# Patient Record
Sex: Male | Born: 1935 | Race: White | Hispanic: No
Health system: Southern US, Community
[De-identification: ages and names within clinical notes are randomized; demographics above are authoritative.]

---

## 2003-08-31 ENCOUNTER — Other Ambulatory Visit: Payer: Self-pay

## 2004-11-29 ENCOUNTER — Ambulatory Visit: Payer: Self-pay | Admitting: Unknown Physician Specialty

## 2006-08-08 ENCOUNTER — Ambulatory Visit: Payer: Self-pay | Admitting: General Practice

## 2006-10-09 ENCOUNTER — Ambulatory Visit: Payer: Self-pay | Admitting: General Practice

## 2006-10-24 ENCOUNTER — Inpatient Hospital Stay: Payer: Self-pay | Admitting: General Practice

## 2008-05-25 ENCOUNTER — Ambulatory Visit: Payer: Self-pay | Admitting: Unknown Physician Specialty

## 2008-06-21 ENCOUNTER — Ambulatory Visit: Payer: Self-pay | Admitting: Unknown Physician Specialty

## 2008-07-19 ENCOUNTER — Ambulatory Visit: Payer: Self-pay | Admitting: Surgery

## 2008-08-23 ENCOUNTER — Ambulatory Visit: Payer: Self-pay | Admitting: Surgery

## 2009-05-23 ENCOUNTER — Ambulatory Visit: Payer: Self-pay | Admitting: Unknown Physician Specialty

## 2009-06-24 ENCOUNTER — Ambulatory Visit: Payer: Self-pay | Admitting: Surgery

## 2009-07-04 ENCOUNTER — Inpatient Hospital Stay: Payer: Self-pay | Admitting: Surgery

## 2010-06-06 ENCOUNTER — Ambulatory Visit: Payer: Self-pay | Admitting: Unknown Physician Specialty

## 2011-06-25 ENCOUNTER — Ambulatory Visit: Payer: Self-pay | Admitting: Unknown Physician Specialty

## 2011-08-30 ENCOUNTER — Ambulatory Visit: Payer: Self-pay | Admitting: Internal Medicine

## 2011-09-27 ENCOUNTER — Inpatient Hospital Stay: Payer: Self-pay | Admitting: Family Medicine

## 2011-09-27 LAB — CBC WITH DIFFERENTIAL/PLATELET
Basophil #: 0 10*3/uL (ref 0.0–0.1)
Basophil %: 0.3 %
Eosinophil %: 0.5 %
HCT: 28.5 % — ABNORMAL LOW (ref 40.0–52.0)
Lymphocyte #: 0.8 10*3/uL — ABNORMAL LOW (ref 1.0–3.6)
MCHC: 30.9 g/dL — ABNORMAL LOW (ref 32.0–36.0)
Monocyte #: 0.5 x10 3/mm (ref 0.2–1.0)
Monocyte %: 3.7 %
Platelet: 764 10*3/uL — ABNORMAL HIGH (ref 150–440)
RDW: 18.2 % — ABNORMAL HIGH (ref 11.5–14.5)
WBC: 14.3 10*3/uL — ABNORMAL HIGH (ref 3.8–10.6)

## 2011-09-27 LAB — RENAL FUNCTION PANEL
Albumin: 2.2 g/dL — ABNORMAL LOW (ref 3.4–5.0)
Anion Gap: 12 (ref 7–16)
BUN: 60 mg/dL — ABNORMAL HIGH (ref 7–18)
Calcium, Total: 9.1 mg/dL (ref 8.5–10.1)
Chloride: 94 mmol/L — ABNORMAL LOW (ref 98–107)
Creatinine: 2.93 mg/dL — ABNORMAL HIGH (ref 0.60–1.30)
EGFR (African American): 23 — ABNORMAL LOW
EGFR (Non-African Amer.): 20 — ABNORMAL LOW
Glucose: 103 mg/dL — ABNORMAL HIGH (ref 65–99)
Osmolality: 282 (ref 275–301)
Phosphorus: 4.7 mg/dL (ref 2.5–4.9)

## 2011-09-27 LAB — URINALYSIS, COMPLETE
Blood: NEGATIVE
Glucose,UR: NEGATIVE mg/dL (ref 0–75)
Ketone: NEGATIVE
Nitrite: NEGATIVE
Ph: 5 (ref 4.5–8.0)
WBC UR: 13 /HPF (ref 0–5)

## 2011-09-27 LAB — TROPONIN I
Troponin-I: <0.02 ng/mL
Troponin-I: <0.02 ng/mL

## 2011-09-27 LAB — CK TOTAL AND CKMB (NOT AT ARMC)
CK, Total: 101 U/L (ref 35–232)
CK-MB: 2.5 ng/mL (ref 0.5–3.6)

## 2011-09-28 LAB — UR PROT ELECTROPHORESIS, URINE RANDOM

## 2011-09-28 LAB — COMPREHENSIVE METABOLIC PANEL
Alkaline Phosphatase: 122 U/L (ref 50–136)
Anion Gap: 7 (ref 7–16)
Calcium, Total: 8.4 mg/dL — ABNORMAL LOW (ref 8.5–10.1)
Chloride: 100 mmol/L (ref 98–107)
Co2: 27 mmol/L (ref 21–32)
EGFR (African American): 39 — ABNORMAL LOW
Osmolality: 280 (ref 275–301)
Potassium: 5.6 mmol/L — ABNORMAL HIGH (ref 3.5–5.1)
SGOT(AST): 29 U/L (ref 15–37)
SGPT (ALT): 27 U/L
Sodium: 134 mmol/L — ABNORMAL LOW (ref 136–145)

## 2011-09-28 LAB — CBC WITH DIFFERENTIAL/PLATELET
Basophil #: 0 10*3/uL (ref 0.0–0.1)
Basophil %: 0.5 %
Eosinophil %: 0.9 %
HCT: 26.4 % — ABNORMAL LOW (ref 40.0–52.0)
Lymphocyte #: 0.8 10*3/uL — ABNORMAL LOW (ref 1.0–3.6)
Lymphocyte %: 7.2 %
MCH: 22.8 pg — ABNORMAL LOW (ref 26.0–34.0)
MCV: 76 fL — ABNORMAL LOW (ref 80–100)
Monocyte #: 0.6 x10 3/mm (ref 0.2–1.0)
Monocyte %: 5.2 %
Neutrophil #: 9.3 10*3/uL — ABNORMAL HIGH (ref 1.4–6.5)
Neutrophil %: 86.2 %
Platelet: 627 10*3/uL — ABNORMAL HIGH (ref 150–440)
RBC: 3.48 10*6/uL — ABNORMAL LOW (ref 4.40–5.90)
RDW: 18.5 % — ABNORMAL HIGH (ref 11.5–14.5)

## 2011-09-28 LAB — CK TOTAL AND CKMB (NOT AT ARMC)
CK, Total: 59 U/L (ref 35–232)
CK-MB: 1.9 ng/mL (ref 0.5–3.6)

## 2011-09-28 LAB — TROPONIN I: Troponin-I: <0.02 ng/mL

## 2011-09-29 LAB — CBC WITH DIFFERENTIAL/PLATELET
Basophil #: 0.1 10*3/uL (ref 0.0–0.1)
Basophil %: 1.2 %
Eosinophil %: 0.7 %
HCT: 26.8 % — ABNORMAL LOW (ref 40.0–52.0)
HGB: 8.3 g/dL — ABNORMAL LOW (ref 13.0–18.0)
Lymphocyte %: 7.6 %
MCH: 23.7 pg — ABNORMAL LOW (ref 26.0–34.0)
MCHC: 30.8 g/dL — ABNORMAL LOW (ref 32.0–36.0)
MCV: 77 fL — ABNORMAL LOW (ref 80–100)
Monocyte #: 0.6 x10 3/mm (ref 0.2–1.0)
Neutrophil #: 10.1 10*3/uL — ABNORMAL HIGH (ref 1.4–6.5)
RBC: 3.49 10*6/uL — ABNORMAL LOW (ref 4.40–5.90)

## 2011-09-29 LAB — RENAL FUNCTION PANEL
Anion Gap: 10 (ref 7–16)
Calcium, Total: 8.4 mg/dL — ABNORMAL LOW (ref 8.5–10.1)
Chloride: 100 mmol/L (ref 98–107)
Glucose: 91 mg/dL (ref 65–99)
Osmolality: 275 (ref 275–301)
Phosphorus: 3.7 mg/dL (ref 2.5–4.9)

## 2011-09-29 LAB — IRON AND TIBC
Iron Saturation: 7 %
Iron: 11 ug/dL — ABNORMAL LOW (ref 65–175)
Unbound Iron-Bind.Cap.: 146 ug/dL

## 2011-09-29 LAB — URINE CULTURE

## 2011-09-30 LAB — CBC WITH DIFFERENTIAL/PLATELET
Basophil #: 0.1 10*3/uL (ref 0.0–0.1)
Basophil %: 0.7 %
Eosinophil #: 0.1 10*3/uL (ref 0.0–0.7)
HCT: 28.9 % — ABNORMAL LOW (ref 40.0–52.0)
MCH: 23.7 pg — ABNORMAL LOW (ref 26.0–34.0)
MCHC: 30.5 g/dL — ABNORMAL LOW (ref 32.0–36.0)
MCV: 78 fL — ABNORMAL LOW (ref 80–100)
Monocyte #: 0.4 x10 3/mm (ref 0.2–1.0)
Monocyte %: 4 %
Neutrophil #: 8.6 10*3/uL — ABNORMAL HIGH (ref 1.4–6.5)
Neutrophil %: 88 %
Platelet: 595 10*3/uL — ABNORMAL HIGH (ref 150–440)

## 2011-09-30 LAB — BASIC METABOLIC PANEL
BUN: 16 mg/dL (ref 7–18)
Calcium, Total: 8.6 mg/dL (ref 8.5–10.1)
Chloride: 99 mmol/L (ref 98–107)
EGFR (African American): >60
EGFR (Non-African Amer.): >60
Glucose: 106 mg/dL — ABNORMAL HIGH (ref 65–99)
Sodium: 134 mmol/L — ABNORMAL LOW (ref 136–145)

## 2011-10-01 LAB — BASIC METABOLIC PANEL
BUN: 14 mg/dL (ref 7–18)
Calcium, Total: 8.3 mg/dL — ABNORMAL LOW (ref 8.5–10.1)
Creatinine: 1.08 mg/dL (ref 0.60–1.30)
EGFR (African American): >60
EGFR (Non-African Amer.): >60
Glucose: 109 mg/dL — ABNORMAL HIGH (ref 65–99)
Potassium: 4.2 mmol/L (ref 3.5–5.1)

## 2011-10-03 LAB — CBC WITH DIFFERENTIAL/PLATELET
Basophil #: 0 10*3/uL (ref 0.0–0.1)
Eosinophil #: 0.1 10*3/uL (ref 0.0–0.7)
Lymphocyte #: 1 10*3/uL (ref 1.0–3.6)
Lymphocyte %: 9.8 %
MCH: 23.5 pg — ABNORMAL LOW (ref 26.0–34.0)
MCV: 76 fL — ABNORMAL LOW (ref 80–100)
Monocyte %: 6.9 %
Neutrophil #: 8.1 10*3/uL — ABNORMAL HIGH (ref 1.4–6.5)
Platelet: 414 10*3/uL (ref 150–440)

## 2011-10-03 LAB — BASIC METABOLIC PANEL
EGFR (Non-African Amer.): 58 — ABNORMAL LOW
Glucose: 109 mg/dL — ABNORMAL HIGH (ref 65–99)
Osmolality: 263 (ref 275–301)
Potassium: 4.4 mmol/L (ref 3.5–5.1)

## 2011-10-04 ENCOUNTER — Ambulatory Visit: Payer: Self-pay | Admitting: Oncology

## 2011-10-04 LAB — CBC CANCER CENTER
Basophil #: 0 x10 3/mm (ref 0.0–0.1)
Basophil %: 0.5 %
Eosinophil #: 0.1 x10 3/mm (ref 0.0–0.7)
Eosinophil %: 1.4 %
HGB: 8.8 g/dL — ABNORMAL LOW (ref 13.0–18.0)
Lymphocyte %: 10 %
MCH: 23.5 pg — ABNORMAL LOW (ref 26.0–34.0)
Monocyte #: 0.5 x10 3/mm (ref 0.2–1.0)
Monocyte %: 6.4 %
Neutrophil #: 5.8 x10 3/mm (ref 1.4–6.5)
Neutrophil %: 81.7 %
RDW: 18.5 % — ABNORMAL HIGH (ref 11.5–14.5)
WBC: 7.1 x10 3/mm (ref 3.8–10.6)

## 2011-10-04 LAB — COMPREHENSIVE METABOLIC PANEL
Alkaline Phosphatase: 131 U/L (ref 50–136)
Anion Gap: 9 (ref 7–16)
Bilirubin,Total: 0.3 mg/dL (ref 0.2–1.0)
EGFR (African American): >60
Osmolality: 268 (ref 275–301)
Potassium: 3.8 mmol/L (ref 3.5–5.1)
SGOT(AST): 33 U/L (ref 15–37)
SGPT (ALT): 28 U/L
Sodium: 133 mmol/L — ABNORMAL LOW (ref 136–145)

## 2011-10-04 LAB — PROTIME-INR: Prothrombin Time: 14 secs (ref 11.5–14.7)

## 2011-10-09 ENCOUNTER — Ambulatory Visit: Payer: Self-pay | Admitting: Surgery

## 2011-10-10 ENCOUNTER — Ambulatory Visit: Payer: Self-pay | Admitting: Oncology

## 2011-10-10 LAB — COMPREHENSIVE METABOLIC PANEL
Alkaline Phosphatase: 150 U/L — ABNORMAL HIGH (ref 50–136)
Bilirubin,Total: 0.5 mg/dL (ref 0.2–1.0)
Calcium, Total: 8.5 mg/dL (ref 8.5–10.1)
Co2: 33 mmol/L — ABNORMAL HIGH (ref 21–32)
EGFR (Non-African Amer.): >60
Glucose: 118 mg/dL — ABNORMAL HIGH (ref 65–99)
Osmolality: 262 (ref 275–301)
Potassium: 4.4 mmol/L (ref 3.5–5.1)
SGOT(AST): 34 U/L (ref 15–37)
SGPT (ALT): 30 U/L
Sodium: 129 mmol/L — ABNORMAL LOW (ref 136–145)

## 2011-10-10 LAB — CBC CANCER CENTER
Basophil #: 0.1 x10 3/mm (ref 0.0–0.1)
Basophil %: 1.4 %
Eosinophil #: 0.1 x10 3/mm (ref 0.0–0.7)
Eosinophil %: 2.4 %
HCT: 28.6 % — ABNORMAL LOW (ref 40.0–52.0)
Lymphocyte #: 1.1 x10 3/mm (ref 1.0–3.6)
Lymphocyte %: 18.7 %
MCH: 23.7 pg — ABNORMAL LOW (ref 26.0–34.0)
MCHC: 31 g/dL — ABNORMAL LOW (ref 32.0–36.0)
Neutrophil %: 71.3 %
RBC: 3.75 10*6/uL — ABNORMAL LOW (ref 4.40–5.90)
RDW: 18.8 % — ABNORMAL HIGH (ref 11.5–14.5)
WBC: 5.6 x10 3/mm (ref 3.8–10.6)

## 2011-10-15 ENCOUNTER — Inpatient Hospital Stay: Payer: Self-pay | Admitting: Oncology

## 2011-10-15 LAB — COMPREHENSIVE METABOLIC PANEL
Albumin: 2.9 g/dL — ABNORMAL LOW (ref 3.4–5.0)
Alkaline Phosphatase: 168 U/L — ABNORMAL HIGH (ref 50–136)
Bilirubin,Total: 0.9 mg/dL (ref 0.2–1.0)
Calcium, Total: 8.6 mg/dL (ref 8.5–10.1)
Chloride: 73 mmol/L — ABNORMAL LOW (ref 98–107)
Co2: 28 mmol/L (ref 21–32)
Osmolality: 224 (ref 275–301)
Potassium: 4.7 mmol/L (ref 3.5–5.1)
SGOT(AST): 50 U/L — ABNORMAL HIGH (ref 15–37)
SGPT (ALT): 54 U/L
Sodium: 110 mmol/L — CL (ref 136–145)
Total Protein: 6.9 g/dL (ref 6.4–8.2)

## 2011-10-15 LAB — CBC CANCER CENTER
Basophil %: 0.4 %
Eosinophil #: 0 x10 3/mm (ref 0.0–0.7)
Eosinophil %: 0.9 %
Lymphocyte #: 0.6 x10 3/mm — ABNORMAL LOW (ref 1.0–3.6)
Lymphocyte %: 12.1 %
MCH: 24 pg — ABNORMAL LOW (ref 26.0–34.0)
MCHC: 32.3 g/dL (ref 32.0–36.0)
MCV: 74 fL — ABNORMAL LOW (ref 80–100)
Monocyte %: 0.7 %
Neutrophil #: 4 x10 3/mm (ref 1.4–6.5)
Neutrophil %: 85.9 %
Platelet: 329 x10 3/mm (ref 150–440)
WBC: 4.6 x10 3/mm (ref 3.8–10.6)

## 2011-10-16 LAB — CBC WITH DIFFERENTIAL/PLATELET
Basophil #: 0 10*3/uL (ref 0.0–0.1)
Basophil %: 0 %
Eosinophil #: 0 10*3/uL (ref 0.0–0.7)
Eosinophil %: 0.1 %
HCT: 24 % — ABNORMAL LOW (ref 40.0–52.0)
HGB: 7.9 g/dL — ABNORMAL LOW (ref 13.0–18.0)
Lymphocyte %: 21.5 %
MCH: 24.3 pg — ABNORMAL LOW (ref 26.0–34.0)
MCHC: 33 g/dL (ref 32.0–36.0)
MCV: 74 fL — ABNORMAL LOW (ref 80–100)
Monocyte #: 0 x10 3/mm — ABNORMAL LOW (ref 0.2–1.0)
Monocyte %: 0.6 %
Neutrophil %: 77.8 %
Platelet: 263 10*3/uL (ref 150–440)
RDW: 18 % — ABNORMAL HIGH (ref 11.5–14.5)
WBC: 1.3 10*3/uL — CL (ref 3.8–10.6)

## 2011-10-16 LAB — COMPREHENSIVE METABOLIC PANEL
Albumin: 2.3 g/dL — ABNORMAL LOW (ref 3.4–5.0)
Anion Gap: 9 (ref 7–16)
BUN: 16 mg/dL (ref 7–18)
Bilirubin,Total: 0.6 mg/dL (ref 0.2–1.0)
Chloride: 79 mmol/L — ABNORMAL LOW (ref 98–107)
EGFR (African American): >60
EGFR (Non-African Amer.): >60
Glucose: 91 mg/dL (ref 65–99)
Osmolality: 230 (ref 275–301)
Potassium: 4.7 mmol/L (ref 3.5–5.1)
SGOT(AST): 42 U/L — ABNORMAL HIGH (ref 15–37)
Sodium: 113 mmol/L — CL (ref 136–145)

## 2011-10-17 LAB — CBC WITH DIFFERENTIAL/PLATELET
Basophil %: 0.2 %
Eosinophil %: 0.2 %
Lymphocyte %: 5.8 %
MCH: 24.2 pg — ABNORMAL LOW (ref 26.0–34.0)
MCHC: 32.8 g/dL (ref 32.0–36.0)
Monocyte #: 0 x10 3/mm — ABNORMAL LOW (ref 0.2–1.0)
Monocyte %: 0.2 %
Neutrophil #: 18.9 10*3/uL — ABNORMAL HIGH (ref 1.4–6.5)
Neutrophil %: 93.6 %
Platelet: 276 10*3/uL (ref 150–440)
RBC: 3.71 10*6/uL — ABNORMAL LOW (ref 4.40–5.90)
RDW: 19 % — ABNORMAL HIGH (ref 11.5–14.5)
WBC: 20.2 10*3/uL — ABNORMAL HIGH (ref 3.8–10.6)

## 2011-10-17 LAB — BASIC METABOLIC PANEL
Anion Gap: 10 (ref 7–16)
BUN: 19 mg/dL — ABNORMAL HIGH (ref 7–18)
Chloride: 85 mmol/L — ABNORMAL LOW (ref 98–107)
Co2: 23 mmol/L (ref 21–32)
EGFR (Non-African Amer.): >60
Osmolality: 239 (ref 275–301)
Potassium: 4.2 mmol/L (ref 3.5–5.1)

## 2011-10-18 LAB — CBC CANCER CENTER
Basophil #: 0 x10 3/mm (ref 0.0–0.1)
Eosinophil %: 0.8 %
HGB: 10.1 g/dL — ABNORMAL LOW (ref 13.0–18.0)
Lymphocyte %: 8.7 %
MCH: 24.3 pg — ABNORMAL LOW (ref 26.0–34.0)
MCHC: 31.9 g/dL — ABNORMAL LOW (ref 32.0–36.0)
Monocyte %: 0.2 %
Neutrophil %: 89.7 %
Platelet: 252 x10 3/mm (ref 150–440)
RBC: 4.13 10*6/uL — ABNORMAL LOW (ref 4.40–5.90)
WBC: 7.5 x10 3/mm (ref 3.8–10.6)

## 2011-10-18 LAB — SODIUM: Sodium: 120 mmol/L — CL (ref 136–145)

## 2011-10-23 LAB — BASIC METABOLIC PANEL
Anion Gap: 6 — ABNORMAL LOW (ref 7–16)
BUN: 17 mg/dL (ref 7–18)
Calcium, Total: 8.3 mg/dL — ABNORMAL LOW (ref 8.5–10.1)
Creatinine: 0.92 mg/dL (ref 0.60–1.30)
Glucose: 98 mg/dL (ref 65–99)
Osmolality: 260 (ref 275–301)
Potassium: 3.8 mmol/L (ref 3.5–5.1)
Sodium: 129 mmol/L — ABNORMAL LOW (ref 136–145)

## 2011-10-23 LAB — CBC CANCER CENTER
Basophil #: 0 x10 3/mm (ref 0.0–0.1)
Basophil %: 2.4 %
Eosinophil #: 0 x10 3/mm (ref 0.0–0.7)
Eosinophil %: 2.5 %
HCT: 29.9 % — ABNORMAL LOW (ref 40.0–52.0)
HGB: 9.4 g/dL — ABNORMAL LOW (ref 13.0–18.0)
Lymphocyte #: 0.7 x10 3/mm — ABNORMAL LOW (ref 1.0–3.6)
Lymphocyte %: 80.5 %
MCV: 77 fL — ABNORMAL LOW (ref 80–100)
Monocyte #: 0 x10 3/mm — ABNORMAL LOW (ref 0.2–1.0)
Neutrophil #: 0.1 x10 3/mm — ABNORMAL LOW (ref 1.4–6.5)
Neutrophil %: 9.8 %
RBC: 3.89 10*6/uL — ABNORMAL LOW (ref 4.40–5.90)

## 2011-10-31 LAB — BASIC METABOLIC PANEL
Calcium, Total: 8.3 mg/dL — ABNORMAL LOW (ref 8.5–10.1)
Chloride: 98 mmol/L (ref 98–107)
EGFR (African American): 52 — ABNORMAL LOW
EGFR (Non-African Amer.): 45 — ABNORMAL LOW
Osmolality: 277 (ref 275–301)
Sodium: 136 mmol/L (ref 136–145)

## 2011-10-31 LAB — CBC CANCER CENTER
Basophil %: 0.2 %
Eosinophil #: 0 x10 3/mm (ref 0.0–0.7)
Eosinophil %: 0.6 %
HCT: 24.1 % — ABNORMAL LOW (ref 40.0–52.0)
Lymphocyte #: 1 x10 3/mm (ref 1.0–3.6)
Lymphocyte %: 43.4 %
MCH: 24.5 pg — ABNORMAL LOW (ref 26.0–34.0)
Monocyte #: 0.4 x10 3/mm (ref 0.2–1.0)
Monocyte %: 15.2 %
Neutrophil #: 0.9 x10 3/mm — ABNORMAL LOW (ref 1.4–6.5)
Neutrophil %: 40.6 %
Platelet: 156 x10 3/mm (ref 150–440)
RBC: 3.17 10*6/uL — ABNORMAL LOW (ref 4.40–5.90)
RDW: 19.1 % — ABNORMAL HIGH (ref 11.5–14.5)
WBC: 2.3 x10 3/mm — ABNORMAL LOW (ref 3.8–10.6)

## 2011-11-07 LAB — COMPREHENSIVE METABOLIC PANEL
Alkaline Phosphatase: 159 U/L — ABNORMAL HIGH (ref 50–136)
Anion Gap: 7 (ref 7–16)
BUN: 24 mg/dL — ABNORMAL HIGH (ref 7–18)
Chloride: 102 mmol/L (ref 98–107)
Co2: 29 mmol/L (ref 21–32)
Creatinine: 1.45 mg/dL — ABNORMAL HIGH (ref 0.60–1.30)
EGFR (Non-African Amer.): 47 — ABNORMAL LOW
Osmolality: 281 (ref 275–301)
Potassium: 4.6 mmol/L (ref 3.5–5.1)
SGOT(AST): 18 U/L (ref 15–37)
SGPT (ALT): 23 U/L
Sodium: 138 mmol/L (ref 136–145)
Total Protein: 6.6 g/dL (ref 6.4–8.2)

## 2011-11-07 LAB — CBC CANCER CENTER
Basophil #: 0 x10 3/mm (ref 0.0–0.1)
Eosinophil #: 0 x10 3/mm (ref 0.0–0.7)
HCT: 25.5 % — ABNORMAL LOW (ref 40.0–52.0)
HGB: 8.2 g/dL — ABNORMAL LOW (ref 13.0–18.0)
Lymphocyte #: 1.8 x10 3/mm (ref 1.0–3.6)
Lymphocyte %: 32.6 %
MCH: 25 pg — ABNORMAL LOW (ref 26.0–34.0)
Neutrophil #: 3 x10 3/mm (ref 1.4–6.5)
Platelet: 520 x10 3/mm — ABNORMAL HIGH (ref 150–440)
RBC: 3.27 10*6/uL — ABNORMAL LOW (ref 4.40–5.90)
RDW: 20.9 % — ABNORMAL HIGH (ref 11.5–14.5)
WBC: 5.4 x10 3/mm (ref 3.8–10.6)

## 2011-11-10 ENCOUNTER — Ambulatory Visit: Payer: Self-pay | Admitting: Oncology

## 2011-11-14 LAB — CBC CANCER CENTER
Basophil #: 0.1 x10 3/mm (ref 0.0–0.1)
Basophil %: 0.6 %
HCT: 26.6 % — ABNORMAL LOW (ref 40.0–52.0)
HGB: 8.4 g/dL — ABNORMAL LOW (ref 13.0–18.0)
Lymphocyte #: 2.2 x10 3/mm (ref 1.0–3.6)
Lymphocyte %: 19.2 %
MCHC: 31.6 g/dL — ABNORMAL LOW (ref 32.0–36.0)
MCV: 79 fL — ABNORMAL LOW (ref 80–100)
Monocyte #: 0.7 x10 3/mm (ref 0.2–1.0)
Monocyte %: 5.8 %
Neutrophil #: 8.6 x10 3/mm — ABNORMAL HIGH (ref 1.4–6.5)
Neutrophil %: 73.4 %
RBC: 3.35 10*6/uL — ABNORMAL LOW (ref 4.40–5.90)
RDW: 22.5 % — ABNORMAL HIGH (ref 11.5–14.5)
WBC: 11.7 x10 3/mm — ABNORMAL HIGH (ref 3.8–10.6)

## 2011-11-21 LAB — CBC CANCER CENTER
Basophil %: 0.5 %
Eosinophil %: 1.4 %
HCT: 28.7 % — ABNORMAL LOW (ref 40.0–52.0)
Lymphocyte #: 2.5 x10 3/mm (ref 1.0–3.6)
Lymphocyte %: 23.2 %
MCH: 25.9 pg — ABNORMAL LOW (ref 26.0–34.0)
Monocyte #: 0.7 x10 3/mm (ref 0.2–1.0)
Monocyte %: 6.6 %
RBC: 3.51 10*6/uL — ABNORMAL LOW (ref 4.40–5.90)
WBC: 10.8 x10 3/mm — ABNORMAL HIGH (ref 3.8–10.6)

## 2011-11-28 LAB — COMPREHENSIVE METABOLIC PANEL
Alkaline Phosphatase: 150 U/L — ABNORMAL HIGH (ref 50–136)
BUN: 25 mg/dL — ABNORMAL HIGH (ref 7–18)
Bilirubin,Total: 0.4 mg/dL (ref 0.2–1.0)
Chloride: 102 mmol/L (ref 98–107)
Co2: 27 mmol/L (ref 21–32)
Creatinine: 1.14 mg/dL (ref 0.60–1.30)
Glucose: 112 mg/dL — ABNORMAL HIGH (ref 65–99)
Potassium: 4.2 mmol/L (ref 3.5–5.1)
SGOT(AST): 17 U/L (ref 15–37)
SGPT (ALT): 22 U/L
Sodium: 138 mmol/L (ref 136–145)

## 2011-11-28 LAB — CBC CANCER CENTER
Eosinophil #: 0.1 x10 3/mm (ref 0.0–0.7)
Eosinophil %: 1.2 %
HCT: 28.8 % — ABNORMAL LOW (ref 40.0–52.0)
HGB: 9.1 g/dL — ABNORMAL LOW (ref 13.0–18.0)
Lymphocyte #: 2.1 x10 3/mm (ref 1.0–3.6)
Lymphocyte %: 22.3 %
MCH: 25.9 pg — ABNORMAL LOW (ref 26.0–34.0)
MCHC: 31.6 g/dL — ABNORMAL LOW (ref 32.0–36.0)
MCV: 82 fL (ref 80–100)
Monocyte #: 0.8 x10 3/mm (ref 0.2–1.0)
Monocyte %: 8.4 %
Neutrophil #: 6.5 x10 3/mm (ref 1.4–6.5)
Neutrophil %: 67.7 %
Platelet: 299 x10 3/mm (ref 150–440)
WBC: 9.6 x10 3/mm (ref 3.8–10.6)

## 2011-12-05 LAB — CBC CANCER CENTER
Eosinophil #: 0.1 x10 3/mm (ref 0.0–0.7)
Eosinophil %: 0.3 %
HGB: 8.1 g/dL — ABNORMAL LOW (ref 13.0–18.0)
Lymphocyte %: 9.6 %
MCH: 26.1 pg (ref 26.0–34.0)
MCHC: 30.8 g/dL — ABNORMAL LOW (ref 32.0–36.0)
MCV: 85 fL (ref 80–100)
Monocyte %: 1.9 %
Neutrophil %: 88 %
RBC: 3.12 10*6/uL — ABNORMAL LOW (ref 4.40–5.90)
WBC: 24.5 x10 3/mm — ABNORMAL HIGH (ref 3.8–10.6)

## 2011-12-10 ENCOUNTER — Ambulatory Visit: Payer: Self-pay | Admitting: Oncology

## 2011-12-11 LAB — CBC CANCER CENTER
Basophil #: 0.1 x10 3/mm (ref 0.0–0.1)
Basophil %: 0.9 %
Eosinophil #: 0.2 x10 3/mm (ref 0.0–0.7)
Eosinophil %: 2.6 %
HGB: 9.2 g/dL — ABNORMAL LOW (ref 13.0–18.0)
Lymphocyte %: 34.3 %
MCH: 27.1 pg (ref 26.0–34.0)
MCHC: 31.3 g/dL — ABNORMAL LOW (ref 32.0–36.0)
MCV: 87 fL (ref 80–100)
Monocyte #: 0.6 x10 3/mm (ref 0.2–1.0)
Monocyte %: 7.5 %
Neutrophil %: 54.7 %
Platelet: 71 x10 3/mm — ABNORMAL LOW (ref 150–440)
RBC: 3.38 10*6/uL — ABNORMAL LOW (ref 4.40–5.90)
RDW: 29.7 % — ABNORMAL HIGH (ref 11.5–14.5)
WBC: 7.7 x10 3/mm (ref 3.8–10.6)

## 2011-12-19 LAB — COMPREHENSIVE METABOLIC PANEL
Albumin: 3.3 g/dL — ABNORMAL LOW (ref 3.4–5.0)
Anion Gap: 8 (ref 7–16)
BUN: 27 mg/dL — ABNORMAL HIGH (ref 7–18)
Bilirubin,Total: 0.3 mg/dL (ref 0.2–1.0)
Chloride: 103 mmol/L (ref 98–107)
Creatinine: 1.3 mg/dL (ref 0.60–1.30)
EGFR (African American): >60
Glucose: 109 mg/dL — ABNORMAL HIGH (ref 65–99)
Osmolality: 281 (ref 275–301)
Potassium: 4.3 mmol/L (ref 3.5–5.1)
SGOT(AST): 15 U/L (ref 15–37)
Sodium: 138 mmol/L (ref 136–145)
Total Protein: 6.7 g/dL (ref 6.4–8.2)

## 2011-12-19 LAB — CBC CANCER CENTER
Eosinophil %: 1.8 %
Lymphocyte #: 1.8 x10 3/mm (ref 1.0–3.6)
Lymphocyte %: 20.7 %
MCH: 27.9 pg (ref 26.0–34.0)
MCV: 87 fL (ref 80–100)
Monocyte #: 0.7 x10 3/mm (ref 0.2–1.0)
Neutrophil %: 68.6 %
Platelet: 288 x10 3/mm (ref 150–440)
RBC: 3.09 10*6/uL — ABNORMAL LOW (ref 4.40–5.90)

## 2011-12-25 LAB — CBC CANCER CENTER
Basophil %: 0.1 %
Eosinophil #: 0.1 x10 3/mm (ref 0.0–0.7)
Eosinophil %: 0.5 %
HGB: 8.5 g/dL — ABNORMAL LOW (ref 13.0–18.0)
Lymphocyte #: 1.7 x10 3/mm (ref 1.0–3.6)
Lymphocyte %: 6.2 %
MCH: 27.9 pg (ref 26.0–34.0)
Monocyte #: 0.2 x10 3/mm (ref 0.2–1.0)
Neutrophil #: 25.7 x10 3/mm — ABNORMAL HIGH (ref 1.4–6.5)
Neutrophil %: 92.4 %
RBC: 3.04 10*6/uL — ABNORMAL LOW (ref 4.40–5.90)

## 2012-01-02 LAB — CBC CANCER CENTER
Basophil #: 0 x10 3/mm (ref 0.0–0.1)
Eosinophil #: 0.1 x10 3/mm (ref 0.0–0.7)
HCT: 26.9 % — ABNORMAL LOW (ref 40.0–52.0)
HGB: 8.6 g/dL — ABNORMAL LOW (ref 13.0–18.0)
Lymphocyte #: 2.2 x10 3/mm (ref 1.0–3.6)
MCH: 29.1 pg (ref 26.0–34.0)
MCHC: 31.9 g/dL — ABNORMAL LOW (ref 32.0–36.0)
MCV: 91 fL (ref 80–100)
Monocyte #: 0.6 x10 3/mm (ref 0.2–1.0)
Monocyte %: 7.2 %
Neutrophil %: 61.8 %
Platelet: 65 x10 3/mm — ABNORMAL LOW (ref 150–440)
RDW: 27.9 % — ABNORMAL HIGH (ref 11.5–14.5)
WBC: 7.7 x10 3/mm (ref 3.8–10.6)

## 2012-01-09 LAB — COMPREHENSIVE METABOLIC PANEL
Albumin: 3.2 g/dL — ABNORMAL LOW (ref 3.4–5.0)
Anion Gap: 11 (ref 7–16)
Bilirubin,Total: 0.4 mg/dL (ref 0.2–1.0)
Chloride: 101 mmol/L (ref 98–107)
Co2: 26 mmol/L (ref 21–32)
EGFR (African American): 56 — ABNORMAL LOW
EGFR (Non-African Amer.): 48 — ABNORMAL LOW
Potassium: 4.3 mmol/L (ref 3.5–5.1)
SGOT(AST): 19 U/L (ref 15–37)
SGPT (ALT): 23 U/L
Total Protein: 6.5 g/dL (ref 6.4–8.2)

## 2012-01-09 LAB — CBC CANCER CENTER
Basophil #: 0.1 x10 3/mm (ref 0.0–0.1)
Eosinophil %: 1.8 %
HCT: 25.1 % — ABNORMAL LOW (ref 40.0–52.0)
HGB: 8.3 g/dL — ABNORMAL LOW (ref 13.0–18.0)
Lymphocyte #: 1.7 x10 3/mm (ref 1.0–3.6)
Lymphocyte %: 21.3 %
MCHC: 33.3 g/dL (ref 32.0–36.0)
Monocyte #: 0.8 x10 3/mm (ref 0.2–1.0)
RDW: 27.6 % — ABNORMAL HIGH (ref 11.5–14.5)

## 2012-01-10 ENCOUNTER — Ambulatory Visit: Payer: Self-pay | Admitting: Oncology

## 2012-01-16 LAB — CBC CANCER CENTER
Basophil #: 0 x10 3/mm (ref 0.0–0.1)
Basophil %: 0.5 %
Eosinophil #: 0.1 x10 3/mm (ref 0.0–0.7)
Eosinophil %: 0.9 %
HCT: 25.5 % — ABNORMAL LOW (ref 40.0–52.0)
HGB: 8.4 g/dL — ABNORMAL LOW (ref 13.0–18.0)
Lymphocyte #: 1.4 x10 3/mm (ref 1.0–3.6)
Lymphocyte %: 15.7 %
MCH: 31.2 pg (ref 26.0–34.0)
MCHC: 32.9 g/dL (ref 32.0–36.0)
MCV: 95 fL (ref 80–100)
Monocyte #: 0.2 x10 3/mm (ref 0.2–1.0)
Neutrophil #: 7.1 x10 3/mm — ABNORMAL HIGH (ref 1.4–6.5)
Neutrophil %: 80.2 %
RBC: 2.69 10*6/uL — ABNORMAL LOW (ref 4.40–5.90)
RDW: 23.9 % — ABNORMAL HIGH (ref 11.5–14.5)

## 2012-01-23 LAB — CBC CANCER CENTER
Basophil %: 0.8 %
Eosinophil #: 0.3 x10 3/mm (ref 0.0–0.7)
Eosinophil %: 3.6 %
HCT: 27.4 % — ABNORMAL LOW (ref 40.0–52.0)
HGB: 8.8 g/dL — ABNORMAL LOW (ref 13.0–18.0)
Lymphocyte #: 2.2 x10 3/mm (ref 1.0–3.6)
MCH: 31 pg (ref 26.0–34.0)
MCHC: 31.9 g/dL — ABNORMAL LOW (ref 32.0–36.0)
MCV: 97 fL (ref 80–100)
Monocyte #: 0.5 x10 3/mm (ref 0.2–1.0)
Monocyte %: 6.8 %
Neutrophil #: 4.7 x10 3/mm (ref 1.4–6.5)
RBC: 2.83 10*6/uL — ABNORMAL LOW (ref 4.40–5.90)
WBC: 7.8 x10 3/mm (ref 3.8–10.6)

## 2012-01-23 LAB — COMPREHENSIVE METABOLIC PANEL
Albumin: 3.5 g/dL (ref 3.4–5.0)
BUN: 25 mg/dL — ABNORMAL HIGH (ref 7–18)
Bilirubin,Total: 0.3 mg/dL (ref 0.2–1.0)
Chloride: 104 mmol/L (ref 98–107)
Co2: 28 mmol/L (ref 21–32)
Creatinine: 1.52 mg/dL — ABNORMAL HIGH (ref 0.60–1.30)
EGFR (African American): 51 — ABNORMAL LOW
EGFR (Non-African Amer.): 44 — ABNORMAL LOW
Osmolality: 282 (ref 275–301)
SGPT (ALT): 34 U/L (ref 12–78)
Sodium: 139 mmol/L (ref 136–145)
Total Protein: 6.8 g/dL (ref 6.4–8.2)

## 2012-01-30 LAB — CBC CANCER CENTER
Basophil %: 0.8 %
Eosinophil #: 0.2 x10 3/mm (ref 0.0–0.7)
Eosinophil %: 2.8 %
HCT: 25.5 % — ABNORMAL LOW (ref 40.0–52.0)
Lymphocyte #: 1.6 x10 3/mm (ref 1.0–3.6)
MCHC: 32.6 g/dL (ref 32.0–36.0)
Monocyte #: 0.7 x10 3/mm (ref 0.2–1.0)
Monocyte %: 9.8 %
Neutrophil #: 4.4 x10 3/mm (ref 1.4–6.5)
Neutrophil %: 63.2 %
RBC: 2.64 10*6/uL — ABNORMAL LOW (ref 4.40–5.90)
RDW: 21.7 % — ABNORMAL HIGH (ref 11.5–14.5)

## 2012-01-30 LAB — BASIC METABOLIC PANEL
Anion Gap: 11 (ref 7–16)
Calcium, Total: 8.6 mg/dL (ref 8.5–10.1)
Co2: 25 mmol/L (ref 21–32)
Creatinine: 1.35 mg/dL — ABNORMAL HIGH (ref 0.60–1.30)
EGFR (African American): 59 — ABNORMAL LOW
Osmolality: 283 (ref 275–301)
Sodium: 139 mmol/L (ref 136–145)

## 2012-02-06 LAB — CBC CANCER CENTER
Basophil %: 0.4 %
Eosinophil #: 0.1 x10 3/mm (ref 0.0–0.7)
Eosinophil %: 0.8 %
Lymphocyte #: 1.4 x10 3/mm (ref 1.0–3.6)
Lymphocyte %: 15.5 %
MCHC: 32.1 g/dL (ref 32.0–36.0)
MCV: 98 fL (ref 80–100)
Monocyte #: 0.3 x10 3/mm (ref 0.2–1.0)
Neutrophil #: 7 x10 3/mm — ABNORMAL HIGH (ref 1.4–6.5)
Neutrophil %: 80 %
Platelet: 229 x10 3/mm (ref 150–440)
RBC: 2.65 10*6/uL — ABNORMAL LOW (ref 4.40–5.90)
RDW: 19.5 % — ABNORMAL HIGH (ref 11.5–14.5)
WBC: 8.8 x10 3/mm (ref 3.8–10.6)

## 2012-02-10 ENCOUNTER — Ambulatory Visit: Payer: Self-pay | Admitting: Oncology

## 2012-02-13 LAB — CBC CANCER CENTER
Basophil #: 0 x10 3/mm (ref 0.0–0.1)
Eosinophil #: 0.1 x10 3/mm (ref 0.0–0.7)
Eosinophil %: 2.7 %
HCT: 25.1 % — ABNORMAL LOW (ref 40.0–52.0)
Lymphocyte #: 1.4 x10 3/mm (ref 1.0–3.6)
MCH: 32 pg (ref 26.0–34.0)
MCHC: 32.4 g/dL (ref 32.0–36.0)
MCV: 99 fL (ref 80–100)
Monocyte #: 0.4 x10 3/mm (ref 0.2–1.0)
Monocyte %: 8.9 %
Neutrophil #: 2.9 x10 3/mm (ref 1.4–6.5)
Platelet: 46 x10 3/mm — ABNORMAL LOW (ref 150–440)
RBC: 2.54 10*6/uL — ABNORMAL LOW (ref 4.40–5.90)
RDW: 18.8 % — ABNORMAL HIGH (ref 11.5–14.5)
WBC: 4.9 x10 3/mm (ref 3.8–10.6)

## 2012-02-13 LAB — BASIC METABOLIC PANEL
Anion Gap: 6 — ABNORMAL LOW (ref 7–16)
BUN: 21 mg/dL — ABNORMAL HIGH (ref 7–18)
Chloride: 104 mmol/L (ref 98–107)
Creatinine: 1.36 mg/dL — ABNORMAL HIGH (ref 0.60–1.30)
EGFR (African American): 58 — ABNORMAL LOW
EGFR (Non-African Amer.): 50 — ABNORMAL LOW
Sodium: 140 mmol/L (ref 136–145)

## 2012-02-13 LAB — HEPATIC FUNCTION PANEL A (ARMC)
Albumin: 3.4 g/dL (ref 3.4–5.0)
Bilirubin, Direct: 0.1 mg/dL (ref 0.00–0.20)
Bilirubin,Total: 0.3 mg/dL (ref 0.2–1.0)
SGOT(AST): 18 U/L (ref 15–37)
SGPT (ALT): 30 U/L (ref 12–78)

## 2012-02-20 LAB — CBC CANCER CENTER
Basophil #: 0 x10 3/mm (ref 0.0–0.1)
Eosinophil %: 1.9 %
Lymphocyte #: 1.7 x10 3/mm (ref 1.0–3.6)
MCH: 31.6 pg (ref 26.0–34.0)
MCHC: 31.9 g/dL — ABNORMAL LOW (ref 32.0–36.0)
MCV: 99 fL (ref 80–100)
Monocyte %: 9.1 %
Neutrophil #: 4.5 x10 3/mm (ref 1.4–6.5)
Neutrophil %: 64.3 %
Platelet: 190 x10 3/mm (ref 150–440)
RBC: 2.74 10*6/uL — ABNORMAL LOW (ref 4.40–5.90)
RDW: 18.8 % — ABNORMAL HIGH (ref 11.5–14.5)

## 2012-02-20 LAB — BASIC METABOLIC PANEL
BUN: 22 mg/dL — ABNORMAL HIGH (ref 7–18)
Chloride: 103 mmol/L (ref 98–107)
Creatinine: 1.47 mg/dL — ABNORMAL HIGH (ref 0.60–1.30)
EGFR (Non-African Amer.): 46 — ABNORMAL LOW
Osmolality: 282 (ref 275–301)
Potassium: 4.8 mmol/L (ref 3.5–5.1)

## 2012-03-11 ENCOUNTER — Ambulatory Visit: Payer: Self-pay | Admitting: Oncology

## 2012-03-19 LAB — COMPREHENSIVE METABOLIC PANEL
BUN: 31 mg/dL — ABNORMAL HIGH (ref 7–18)
Bilirubin,Total: 0.5 mg/dL (ref 0.2–1.0)
Calcium, Total: 8.5 mg/dL (ref 8.5–10.1)
Chloride: 104 mmol/L (ref 98–107)
Co2: 28 mmol/L (ref 21–32)
EGFR (African American): >60
EGFR (Non-African Amer.): 54 — ABNORMAL LOW
Osmolality: 289 (ref 275–301)
Potassium: 4.3 mmol/L (ref 3.5–5.1)
SGOT(AST): 14 U/L — ABNORMAL LOW (ref 15–37)
SGPT (ALT): 26 U/L (ref 12–78)
Sodium: 142 mmol/L (ref 136–145)
Total Protein: 6.6 g/dL (ref 6.4–8.2)

## 2012-03-19 LAB — CBC CANCER CENTER
Basophil %: 0.7 %
Eosinophil #: 0.7 x10 3/mm (ref 0.0–0.7)
HCT: 33.6 % — ABNORMAL LOW (ref 40.0–52.0)
HGB: 10.6 g/dL — ABNORMAL LOW (ref 13.0–18.0)
Lymphocyte #: 2.3 x10 3/mm (ref 1.0–3.6)
Lymphocyte %: 24.8 %
MCHC: 31.4 g/dL — ABNORMAL LOW (ref 32.0–36.0)
MCV: 95 fL (ref 80–100)
Monocyte %: 6 %
Neutrophil #: 5.8 x10 3/mm (ref 1.4–6.5)
RBC: 3.55 10*6/uL — ABNORMAL LOW (ref 4.40–5.90)
RDW: 17 % — ABNORMAL HIGH (ref 11.5–14.5)

## 2012-03-26 LAB — CBC CANCER CENTER
Basophil #: 0.1 x10 3/mm (ref 0.0–0.1)
Lymphocyte #: 1.6 x10 3/mm (ref 1.0–3.6)
Lymphocyte %: 15.5 %
MCH: 29.5 pg (ref 26.0–34.0)
MCV: 94 fL (ref 80–100)
Monocyte #: 0.4 x10 3/mm (ref 0.2–1.0)
Monocyte %: 4.1 %
Platelet: 254 x10 3/mm (ref 150–440)
RDW: 17 % — ABNORMAL HIGH (ref 11.5–14.5)
WBC: 10.2 x10 3/mm (ref 3.8–10.6)

## 2012-04-11 ENCOUNTER — Ambulatory Visit: Payer: Self-pay | Admitting: Oncology

## 2012-04-30 LAB — COMPREHENSIVE METABOLIC PANEL
Albumin: 3.3 g/dL — ABNORMAL LOW (ref 3.4–5.0)
Alkaline Phosphatase: 115 U/L (ref 50–136)
Anion Gap: 9 (ref 7–16)
Bilirubin,Total: 0.5 mg/dL (ref 0.2–1.0)
Calcium, Total: 9.1 mg/dL (ref 8.5–10.1)
Co2: 26 mmol/L (ref 21–32)
Creatinine: 1.55 mg/dL — ABNORMAL HIGH (ref 0.60–1.30)
EGFR (African American): 50 — ABNORMAL LOW
Glucose: 102 mg/dL — ABNORMAL HIGH (ref 65–99)
Osmolality: 280 (ref 275–301)
Potassium: 4.5 mmol/L (ref 3.5–5.1)
Sodium: 138 mmol/L (ref 136–145)

## 2012-04-30 LAB — CBC CANCER CENTER
Basophil %: 1.2 %
Eosinophil #: 0.1 x10 3/mm (ref 0.0–0.7)
Eosinophil %: 1.8 %
HGB: 11.1 g/dL — ABNORMAL LOW (ref 13.0–18.0)
Lymphocyte #: 1.5 x10 3/mm (ref 1.0–3.6)
Lymphocyte %: 25.1 %
MCH: 28.7 pg (ref 26.0–34.0)
MCV: 90 fL (ref 80–100)
Monocyte #: 0.4 x10 3/mm (ref 0.2–1.0)
Neutrophil %: 64.7 %
Platelet: 190 x10 3/mm (ref 150–440)
RBC: 3.87 10*6/uL — ABNORMAL LOW (ref 4.40–5.90)

## 2012-05-11 ENCOUNTER — Ambulatory Visit: Payer: Self-pay | Admitting: Oncology

## 2012-06-11 ENCOUNTER — Ambulatory Visit: Payer: Self-pay | Admitting: Oncology

## 2012-06-12 LAB — CBC CANCER CENTER
Basophil #: 0 x10 3/mm (ref 0.0–0.1)
Basophil %: 0.8 %
Eosinophil #: 0.6 x10 3/mm (ref 0.0–0.7)
Eosinophil %: 11.3 %
Lymphocyte #: 2 x10 3/mm (ref 1.0–3.6)
Lymphocyte %: 34.2 %
MCV: 86 fL (ref 80–100)
Monocyte #: 0.4 x10 3/mm (ref 0.2–1.0)
Monocyte %: 6.8 %
Neutrophil #: 2.7 x10 3/mm (ref 1.4–6.5)
Neutrophil %: 46.9 %
Platelet: 209 x10 3/mm (ref 150–440)
RBC: 4.32 10*6/uL — ABNORMAL LOW (ref 4.40–5.90)
RDW: 16.1 % — ABNORMAL HIGH (ref 11.5–14.5)

## 2012-06-12 LAB — COMPREHENSIVE METABOLIC PANEL
Albumin: 3.7 g/dL (ref 3.4–5.0)
Alkaline Phosphatase: 128 U/L (ref 50–136)
Calcium, Total: 9.3 mg/dL (ref 8.5–10.1)
Chloride: 101 mmol/L (ref 98–107)
Creatinine: 1.55 mg/dL — ABNORMAL HIGH (ref 0.60–1.30)
EGFR (African American): 50 — ABNORMAL LOW
Potassium: 4.5 mmol/L (ref 3.5–5.1)
SGOT(AST): 17 U/L (ref 15–37)
Total Protein: 7.3 g/dL (ref 6.4–8.2)

## 2012-06-25 LAB — COMPREHENSIVE METABOLIC PANEL
Albumin: 3.7 g/dL (ref 3.4–5.0)
BUN: 32 mg/dL — ABNORMAL HIGH (ref 7–18)
Bilirubin,Total: 0.7 mg/dL (ref 0.2–1.0)
Calcium, Total: 8.9 mg/dL (ref 8.5–10.1)
EGFR (Non-African Amer.): 38 — ABNORMAL LOW
Osmolality: 286 (ref 275–301)
Total Protein: 7 g/dL (ref 6.4–8.2)

## 2012-06-25 LAB — CBC CANCER CENTER
Eosinophil #: 0.3 x10 3/mm (ref 0.0–0.7)
HGB: 11.6 g/dL — ABNORMAL LOW (ref 13.0–18.0)
Lymphocyte #: 1.5 x10 3/mm (ref 1.0–3.6)
Lymphocyte %: 27.9 %
MCH: 30.1 pg (ref 26.0–34.0)
MCHC: 34.9 g/dL (ref 32.0–36.0)
MCV: 86 fL (ref 80–100)
Monocyte #: 0.4 x10 3/mm (ref 0.2–1.0)
Monocyte %: 7.9 %
RBC: 3.84 10*6/uL — ABNORMAL LOW (ref 4.40–5.90)

## 2012-07-04 LAB — CBC CANCER CENTER
Basophil #: 0 x10 3/mm (ref 0.0–0.1)
Basophil %: 0.4 %
Eosinophil #: 0.1 x10 3/mm (ref 0.0–0.7)
Eosinophil %: 5.9 %
HCT: 29.1 % — ABNORMAL LOW (ref 40.0–52.0)
Lymphocyte #: 0.9 x10 3/mm — ABNORMAL LOW (ref 1.0–3.6)
Lymphocyte %: 90.1 %
MCH: 28.8 pg (ref 26.0–34.0)
MCV: 86 fL (ref 80–100)
Monocyte #: 0 x10 3/mm — ABNORMAL LOW (ref 0.2–1.0)
Neutrophil %: 0.8 %
Platelet: 80 x10 3/mm — ABNORMAL LOW (ref 150–440)
RBC: 3.4 10*6/uL — ABNORMAL LOW (ref 4.40–5.90)
RDW: 14.9 % — ABNORMAL HIGH (ref 11.5–14.5)
WBC: 1 x10 3/mm — CL (ref 3.8–10.6)

## 2012-07-04 LAB — COMPREHENSIVE METABOLIC PANEL
Albumin: 3.3 g/dL — ABNORMAL LOW (ref 3.4–5.0)
Bilirubin,Total: 0.8 mg/dL (ref 0.2–1.0)
Chloride: 104 mmol/L (ref 98–107)
Co2: 24 mmol/L (ref 21–32)
Creatinine: 2.06 mg/dL — ABNORMAL HIGH (ref 0.60–1.30)
EGFR (African American): 35 — ABNORMAL LOW
Potassium: 4.5 mmol/L (ref 3.5–5.1)

## 2012-07-10 LAB — BASIC METABOLIC PANEL
Anion Gap: 10 (ref 7–16)
Calcium, Total: 8.7 mg/dL (ref 8.5–10.1)
Chloride: 101 mmol/L (ref 98–107)
Co2: 30 mmol/L (ref 21–32)
EGFR (African American): 42 — ABNORMAL LOW
EGFR (Non-African Amer.): 36 — ABNORMAL LOW
Potassium: 3.5 mmol/L (ref 3.5–5.1)
Sodium: 141 mmol/L (ref 136–145)

## 2012-07-10 LAB — CBC CANCER CENTER
Basophil #: 0 x10 3/mm (ref 0.0–0.1)
Basophil %: 0.2 %
Eosinophil #: 0.2 x10 3/mm (ref 0.0–0.7)
Eosinophil %: 8.1 %
Lymphocyte #: 1.1 x10 3/mm (ref 1.0–3.6)
MCH: 29.4 pg (ref 26.0–34.0)
Neutrophil #: 0.6 x10 3/mm — ABNORMAL LOW (ref 1.4–6.5)
Neutrophil %: 29 %
RBC: 3.21 10*6/uL — ABNORMAL LOW (ref 4.40–5.90)
WBC: 2 x10 3/mm — CL (ref 3.8–10.6)

## 2012-07-12 ENCOUNTER — Ambulatory Visit: Payer: Self-pay | Admitting: Oncology

## 2012-07-16 LAB — CBC CANCER CENTER
Basophil #: 0 x10 3/mm (ref 0.0–0.1)
Basophil %: 0.3 %
Eosinophil %: 1.4 %
HCT: 26.4 % — ABNORMAL LOW (ref 40.0–52.0)
Lymphocyte %: 38.5 %
MCH: 29.6 pg (ref 26.0–34.0)
MCHC: 34.2 g/dL (ref 32.0–36.0)
Monocyte #: 0.4 x10 3/mm (ref 0.2–1.0)
Monocyte %: 10.8 %
Neutrophil #: 1.7 x10 3/mm (ref 1.4–6.5)
Neutrophil %: 49 %
Platelet: 310 x10 3/mm (ref 150–440)
RBC: 3.06 10*6/uL — ABNORMAL LOW (ref 4.40–5.90)
RDW: 14.5 % (ref 11.5–14.5)

## 2012-07-16 LAB — COMPREHENSIVE METABOLIC PANEL
Albumin: 2.8 g/dL — ABNORMAL LOW (ref 3.4–5.0)
Anion Gap: 8 (ref 7–16)
BUN: 23 mg/dL — ABNORMAL HIGH (ref 7–18)
Co2: 31 mmol/L (ref 21–32)
Creatinine: 2.07 mg/dL — ABNORMAL HIGH (ref 0.60–1.30)
Osmolality: 288 (ref 275–301)
Potassium: 3.9 mmol/L (ref 3.5–5.1)
Total Protein: 6.2 g/dL — ABNORMAL LOW (ref 6.4–8.2)

## 2012-07-26 ENCOUNTER — Observation Stay: Payer: Self-pay | Admitting: Family Medicine

## 2012-07-26 LAB — DIFFERENTIAL
Basophil #: 0 10*3/uL (ref 0.0–0.1)
Eosinophil %: 1.8 %
Lymphocyte #: 0.6 10*3/uL — ABNORMAL LOW (ref 1.0–3.6)
Monocyte %: 10.7 %
Neutrophil #: 0.1 10*3/uL — ABNORMAL LOW (ref 1.4–6.5)

## 2012-07-26 LAB — CBC
HCT: 25.2 % — ABNORMAL LOW (ref 40.0–52.0)
HGB: 8.6 g/dL — ABNORMAL LOW (ref 13.0–18.0)
MCHC: 34.2 g/dL (ref 32.0–36.0)
Platelet: 203 10*3/uL (ref 150–440)
RBC: 2.86 10*6/uL — ABNORMAL LOW (ref 4.40–5.90)
RDW: 14 % (ref 11.5–14.5)
WBC: 0.8 10*3/uL — CL (ref 3.8–10.6)

## 2012-07-26 LAB — URINALYSIS, COMPLETE
Bacteria: NONE SEEN
Bilirubin,UR: NEGATIVE
Blood: NEGATIVE
Leukocyte Esterase: NEGATIVE
Ph: 6 (ref 4.5–8.0)
Specific Gravity: 1.012 (ref 1.003–1.030)
Squamous Epithelial: 1

## 2012-07-26 LAB — COMPREHENSIVE METABOLIC PANEL
Albumin: 3.2 g/dL — ABNORMAL LOW (ref 3.4–5.0)
Alkaline Phosphatase: 104 U/L (ref 50–136)
BUN: 20 mg/dL — ABNORMAL HIGH (ref 7–18)
Bilirubin,Total: 0.5 mg/dL (ref 0.2–1.0)
Co2: 27 mmol/L (ref 21–32)
Creatinine: 2.08 mg/dL — ABNORMAL HIGH (ref 0.60–1.30)
EGFR (African American): 35 — ABNORMAL LOW
EGFR (Non-African Amer.): 30 — ABNORMAL LOW
Glucose: 155 mg/dL — ABNORMAL HIGH (ref 65–99)
Osmolality: 280 (ref 275–301)
Potassium: 4 mmol/L (ref 3.5–5.1)
Sodium: 137 mmol/L (ref 136–145)

## 2012-07-26 LAB — CK TOTAL AND CKMB (NOT AT ARMC): CK, Total: 37 U/L (ref 35–232)

## 2012-07-26 LAB — TROPONIN I: Troponin-I: <0.02 ng/mL

## 2012-07-26 LAB — MAGNESIUM: Magnesium: 1.5 mg/dL — ABNORMAL LOW

## 2012-07-27 LAB — CBC WITH DIFFERENTIAL/PLATELET
Basophil: 1 %
Eosinophil: 3 %
HCT: 21.7 % — ABNORMAL LOW (ref 40.0–52.0)
Lymphocytes: 72 %
MCHC: 31.4 g/dL — ABNORMAL LOW (ref 32.0–36.0)
MCV: 88 fL (ref 80–100)
Platelet: 155 10*3/uL (ref 150–440)
RBC: 2.47 10*6/uL — ABNORMAL LOW (ref 4.40–5.90)
RDW: 14 % (ref 11.5–14.5)
Segmented Neutrophils: 13 %
Variant Lymphocyte - H1-Rlymph: 5 %
WBC: 1 10*3/uL — CL (ref 3.8–10.6)

## 2012-07-27 LAB — BASIC METABOLIC PANEL
Anion Gap: 7 (ref 7–16)
BUN: 17 mg/dL (ref 7–18)
Calcium, Total: 8 mg/dL — ABNORMAL LOW (ref 8.5–10.1)
Co2: 27 mmol/L (ref 21–32)
Creatinine: 1.44 mg/dL — ABNORMAL HIGH (ref 0.60–1.30)
EGFR (African American): 54 — ABNORMAL LOW
EGFR (Non-African Amer.): 47 — ABNORMAL LOW
Glucose: 116 mg/dL — ABNORMAL HIGH (ref 65–99)
Potassium: 4.1 mmol/L (ref 3.5–5.1)

## 2012-07-27 LAB — CLOSTRIDIUM DIFFICILE BY PCR

## 2012-07-27 LAB — WBCS, STOOL

## 2012-07-28 LAB — BASIC METABOLIC PANEL
Anion Gap: 6 — ABNORMAL LOW (ref 7–16)
Calcium, Total: 7.9 mg/dL — ABNORMAL LOW (ref 8.5–10.1)
Co2: 28 mmol/L (ref 21–32)
Creatinine: 1.06 mg/dL (ref 0.60–1.30)
Creatinine: 1.32 mg/dL — ABNORMAL HIGH (ref 0.60–1.30)
EGFR (African American): >60
EGFR (African American): >60
EGFR (Non-African Amer.): >60
EGFR (Non-African Amer.): 52 — ABNORMAL LOW
Glucose: 112 mg/dL — ABNORMAL HIGH (ref 65–99)
Osmolality: 282 (ref 275–301)
Potassium: 5.9 mmol/L — ABNORMAL HIGH (ref 3.5–5.1)
Potassium: 4.2 mmol/L (ref 3.5–5.1)

## 2012-07-28 LAB — CBC WITH DIFFERENTIAL/PLATELET
Basophil: 1 %
HCT: 24.1 % — ABNORMAL LOW (ref 40.0–52.0)
HGB: 8.3 g/dL — ABNORMAL LOW (ref 13.0–18.0)
MCH: 30.4 pg (ref 26.0–34.0)
MCV: 89 fL (ref 80–100)
Platelet: 146 10*3/uL — ABNORMAL LOW (ref 150–440)
RBC: 2.72 10*6/uL — ABNORMAL LOW (ref 4.40–5.90)
Segmented Neutrophils: 11 %
WBC: 1.3 10*3/uL — CL (ref 3.8–10.6)

## 2012-07-30 LAB — BASIC METABOLIC PANEL WITH GFR
Anion Gap: 11 (ref 7–16)
BUN: 15 mg/dL (ref 7–18)
Calcium, Total: 8.3 mg/dL — ABNORMAL LOW (ref 8.5–10.1)
Chloride: 105 mmol/L (ref 98–107)
Co2: 26 mmol/L (ref 21–32)
Creatinine: 1.91 mg/dL — ABNORMAL HIGH (ref 0.60–1.30)
EGFR (African American): 39 — ABNORMAL LOW
EGFR (Non-African Amer.): 33 — ABNORMAL LOW
Glucose: 159 mg/dL — ABNORMAL HIGH (ref 65–99)
Osmolality: 287 (ref 275–301)
Potassium: 3.6 mmol/L (ref 3.5–5.1)
Sodium: 142 mmol/L (ref 136–145)

## 2012-07-30 LAB — CBC CANCER CENTER
Basophil #: 0 x10 3/mm (ref 0.0–0.1)
Basophil %: 0.4 %
Eosinophil #: 0.1 x10 3/mm (ref 0.0–0.7)
HCT: 27.8 % — ABNORMAL LOW (ref 40.0–52.0)
Lymphocyte #: 0.9 x10 3/mm — ABNORMAL LOW (ref 1.0–3.6)
MCH: 30.5 pg (ref 26.0–34.0)
Monocyte %: 10.2 %
Neutrophil %: 21.9 %
RBC: 3.12 10*6/uL — ABNORMAL LOW (ref 4.40–5.90)
RDW: 14.3 % (ref 11.5–14.5)

## 2012-08-06 LAB — CBC CANCER CENTER
Basophil %: 1 %
Eosinophil %: 4.9 %
HCT: 30.2 % — ABNORMAL LOW (ref 40.0–52.0)
HGB: 10.3 g/dL — ABNORMAL LOW (ref 13.0–18.0)
Lymphocyte #: 1.4 x10 3/mm (ref 1.0–3.6)
Lymphocyte %: 34.6 %
MCH: 30.9 pg (ref 26.0–34.0)
MCHC: 33.9 g/dL (ref 32.0–36.0)
MCV: 91 fL (ref 80–100)
Monocyte #: 0.4 x10 3/mm (ref 0.2–1.0)
Monocyte %: 9 %
Neutrophil #: 2 x10 3/mm (ref 1.4–6.5)
Neutrophil %: 50.5 %
Platelet: 232 x10 3/mm (ref 150–440)
RBC: 3.33 10*6/uL — ABNORMAL LOW (ref 4.40–5.90)
RDW: 18.3 % — ABNORMAL HIGH (ref 11.5–14.5)
WBC: 4 x10 3/mm (ref 3.8–10.6)

## 2012-08-06 LAB — COMPREHENSIVE METABOLIC PANEL
Alkaline Phosphatase: 96 U/L (ref 50–136)
Anion Gap: 8 (ref 7–16)
BUN: 13 mg/dL (ref 7–18)
Calcium, Total: 8.4 mg/dL — ABNORMAL LOW (ref 8.5–10.1)
Chloride: 105 mmol/L (ref 98–107)
Co2: 28 mmol/L (ref 21–32)
Creatinine: 1.61 mg/dL — ABNORMAL HIGH (ref 0.60–1.30)
EGFR (African American): 47 — ABNORMAL LOW
EGFR (Non-African Amer.): 41 — ABNORMAL LOW
Glucose: 157 mg/dL — ABNORMAL HIGH (ref 65–99)
Osmolality: 285 (ref 275–301)
Potassium: 4 mmol/L (ref 3.5–5.1)
SGPT (ALT): 18 U/L (ref 12–78)
Sodium: 141 mmol/L (ref 136–145)

## 2012-08-09 ENCOUNTER — Ambulatory Visit: Payer: Self-pay | Admitting: Oncology

## 2012-08-20 LAB — CBC CANCER CENTER
Basophil %: 0.7 %
Eosinophil #: 0.3 x10 3/mm (ref 0.0–0.7)
Eosinophil %: 12.2 %
HCT: 28.7 % — ABNORMAL LOW (ref 40.0–52.0)
HGB: 9.8 g/dL — ABNORMAL LOW (ref 13.0–18.0)
Lymphocyte %: 36.9 %
MCH: 31.6 pg (ref 26.0–34.0)
Monocyte #: 0.2 x10 3/mm (ref 0.2–1.0)
Monocyte %: 9 %
Platelet: 145 x10 3/mm — ABNORMAL LOW (ref 150–440)
RDW: 20.7 % — ABNORMAL HIGH (ref 11.5–14.5)
WBC: 2.7 x10 3/mm — ABNORMAL LOW (ref 3.8–10.6)

## 2012-08-20 LAB — BASIC METABOLIC PANEL
Chloride: 105 mmol/L (ref 98–107)
Co2: 28 mmol/L (ref 21–32)
Creatinine: 1.88 mg/dL — ABNORMAL HIGH (ref 0.60–1.30)
Glucose: 120 mg/dL — ABNORMAL HIGH (ref 65–99)
Osmolality: 288 (ref 275–301)
Potassium: 3.8 mmol/L (ref 3.5–5.1)
Sodium: 143 mmol/L (ref 136–145)

## 2012-09-03 LAB — CBC CANCER CENTER
Basophil #: 0 x10 3/mm (ref 0.0–0.1)
Basophil %: 0.3 %
Eosinophil %: 1.9 %
HCT: 28.1 % — ABNORMAL LOW (ref 40.0–52.0)
HGB: 9.7 g/dL — ABNORMAL LOW (ref 13.0–18.0)
Lymphocyte #: 1.2 x10 3/mm (ref 1.0–3.6)
Lymphocyte %: 56.3 %
MCH: 31.6 pg (ref 26.0–34.0)
MCHC: 34.4 g/dL (ref 32.0–36.0)
Platelet: 155 x10 3/mm (ref 150–440)
WBC: 2.1 x10 3/mm — ABNORMAL LOW (ref 3.8–10.6)

## 2012-09-03 LAB — COMPREHENSIVE METABOLIC PANEL
Albumin: 3.2 g/dL — ABNORMAL LOW (ref 3.4–5.0)
Alkaline Phosphatase: 118 U/L (ref 50–136)
Chloride: 106 mmol/L (ref 98–107)
Co2: 30 mmol/L (ref 21–32)
Creatinine: 1.6 mg/dL — ABNORMAL HIGH (ref 0.60–1.30)
EGFR (Non-African Amer.): 41 — ABNORMAL LOW
Glucose: 135 mg/dL — ABNORMAL HIGH (ref 65–99)
SGOT(AST): 14 U/L — ABNORMAL LOW (ref 15–37)
Sodium: 143 mmol/L (ref 136–145)
Total Protein: 6.6 g/dL (ref 6.4–8.2)

## 2012-09-09 ENCOUNTER — Ambulatory Visit: Payer: Self-pay | Admitting: Oncology

## 2012-09-10 LAB — COMPREHENSIVE METABOLIC PANEL
Albumin: 3.3 g/dL — ABNORMAL LOW (ref 3.4–5.0)
Anion Gap: 8 (ref 7–16)
Bilirubin,Total: 0.5 mg/dL (ref 0.2–1.0)
Calcium, Total: 8.2 mg/dL — ABNORMAL LOW (ref 8.5–10.1)
Co2: 28 mmol/L (ref 21–32)
EGFR (African American): 49 — ABNORMAL LOW
EGFR (Non-African Amer.): 43 — ABNORMAL LOW
Glucose: 149 mg/dL — ABNORMAL HIGH (ref 65–99)
Osmolality: 288 (ref 275–301)
SGOT(AST): 15 U/L (ref 15–37)
SGPT (ALT): 20 U/L (ref 12–78)
Sodium: 142 mmol/L (ref 136–145)
Total Protein: 6.6 g/dL (ref 6.4–8.2)

## 2012-09-10 LAB — CBC CANCER CENTER
Basophil %: 0.8 %
Eosinophil #: 0.1 x10 3/mm (ref 0.0–0.7)
HGB: 9.8 g/dL — ABNORMAL LOW (ref 13.0–18.0)
Lymphocyte %: 38.1 %
Monocyte #: 0.2 x10 3/mm (ref 0.2–1.0)
Monocyte %: 6.7 %
Neutrophil %: 51.4 %
Platelet: 188 x10 3/mm (ref 150–440)
RBC: 3.11 10*6/uL — ABNORMAL LOW (ref 4.40–5.90)
RDW: 21.4 % — ABNORMAL HIGH (ref 11.5–14.5)

## 2012-10-01 LAB — CBC CANCER CENTER
Basophil #: 0 x10 3/mm (ref 0.0–0.1)
Basophil %: 1.1 %
Eosinophil #: 0.2 x10 3/mm (ref 0.0–0.7)
Eosinophil %: 5.2 %
HCT: 27.9 % — ABNORMAL LOW (ref 40.0–52.0)
HGB: 9.5 g/dL — ABNORMAL LOW (ref 13.0–18.0)
Lymphocyte #: 1.3 x10 3/mm (ref 1.0–3.6)
MCHC: 33.9 g/dL (ref 32.0–36.0)
Monocyte #: 0.2 x10 3/mm (ref 0.2–1.0)
Neutrophil #: 1.5 x10 3/mm (ref 1.4–6.5)
Platelet: 189 x10 3/mm (ref 150–440)
RBC: 2.9 10*6/uL — ABNORMAL LOW (ref 4.40–5.90)
RDW: 21.8 % — ABNORMAL HIGH (ref 11.5–14.5)

## 2012-10-01 LAB — COMPREHENSIVE METABOLIC PANEL
BUN: 16 mg/dL (ref 7–18)
Chloride: 107 mmol/L (ref 98–107)
Co2: 24 mmol/L (ref 21–32)
Creatinine: 1.55 mg/dL — ABNORMAL HIGH (ref 0.60–1.30)
Glucose: 128 mg/dL — ABNORMAL HIGH (ref 65–99)
Potassium: 3.9 mmol/L (ref 3.5–5.1)
SGOT(AST): 15 U/L (ref 15–37)
Total Protein: 6.5 g/dL (ref 6.4–8.2)

## 2012-10-09 ENCOUNTER — Ambulatory Visit: Payer: Self-pay | Admitting: Oncology

## 2012-10-22 LAB — CBC CANCER CENTER
Basophil #: 0 x10 3/mm (ref 0.0–0.1)
Eosinophil #: 0.1 x10 3/mm (ref 0.0–0.7)
HCT: 25.7 % — ABNORMAL LOW (ref 40.0–52.0)
Lymphocyte #: 1.1 x10 3/mm (ref 1.0–3.6)
Lymphocyte %: 30.8 %
MCHC: 34.4 g/dL (ref 32.0–36.0)
MCV: 98 fL (ref 80–100)
Monocyte #: 0.3 x10 3/mm (ref 0.2–1.0)
Neutrophil #: 2 x10 3/mm (ref 1.4–6.5)
Neutrophil %: 56.3 %
Platelet: 163 x10 3/mm (ref 150–440)
WBC: 3.5 x10 3/mm — ABNORMAL LOW (ref 3.8–10.6)

## 2012-10-22 LAB — COMPREHENSIVE METABOLIC PANEL
Albumin: 3.3 g/dL — ABNORMAL LOW (ref 3.4–5.0)
Alkaline Phosphatase: 133 U/L (ref 50–136)
BUN: 17 mg/dL (ref 7–18)
Calcium, Total: 8.5 mg/dL (ref 8.5–10.1)
EGFR (African American): 54 — ABNORMAL LOW
EGFR (Non-African Amer.): 47 — ABNORMAL LOW
Osmolality: 284 (ref 275–301)
Potassium: 3.6 mmol/L (ref 3.5–5.1)
SGPT (ALT): 18 U/L (ref 12–78)
Sodium: 142 mmol/L (ref 136–145)
Total Protein: 6.5 g/dL (ref 6.4–8.2)

## 2012-10-30 LAB — CBC CANCER CENTER
Basophil #: 0 x10 3/mm (ref 0.0–0.1)
Eosinophil %: 8.3 %
Lymphocyte #: 0.6 x10 3/mm — ABNORMAL LOW (ref 1.0–3.6)
Lymphocyte %: 74.2 %
MCH: 33.1 pg (ref 26.0–34.0)
MCV: 97 fL (ref 80–100)
Monocyte #: 0.1 x10 3/mm — ABNORMAL LOW (ref 0.2–1.0)
Neutrophil #: 0.1 x10 3/mm — ABNORMAL LOW (ref 1.4–6.5)
Neutrophil %: 7.9 %
Platelet: 138 x10 3/mm — ABNORMAL LOW (ref 150–440)
RDW: 17.3 % — ABNORMAL HIGH (ref 11.5–14.5)
WBC: 0.8 x10 3/mm — CL (ref 3.8–10.6)

## 2012-10-30 LAB — COMPREHENSIVE METABOLIC PANEL
Alkaline Phosphatase: 125 U/L (ref 50–136)
Bilirubin,Total: 0.8 mg/dL (ref 0.2–1.0)
Co2: 25 mmol/L (ref 21–32)
Creatinine: 1.61 mg/dL — ABNORMAL HIGH (ref 0.60–1.30)
EGFR (African American): 47 — ABNORMAL LOW
EGFR (Non-African Amer.): 41 — ABNORMAL LOW
Glucose: 107 mg/dL — ABNORMAL HIGH (ref 65–99)
Potassium: 3.8 mmol/L (ref 3.5–5.1)
SGOT(AST): 14 U/L — ABNORMAL LOW (ref 15–37)
SGPT (ALT): 18 U/L (ref 12–78)
Total Protein: 6.7 g/dL (ref 6.4–8.2)

## 2012-10-31 LAB — CBC CANCER CENTER
Basophil #: 0 x10 3/mm (ref 0.0–0.1)
Basophil %: 0.7 %
Eosinophil #: 0.1 x10 3/mm (ref 0.0–0.7)
Eosinophil %: 10 %
HCT: 20.3 % — ABNORMAL LOW (ref 40.0–52.0)
HGB: 7.1 g/dL — ABNORMAL LOW (ref 13.0–18.0)
Lymphocyte #: 0.5 x10 3/mm — ABNORMAL LOW (ref 1.0–3.6)
Lymphocyte %: 60.6 %
Monocyte #: 0.1 x10 3/mm — ABNORMAL LOW (ref 0.2–1.0)
RBC: 2.11 10*6/uL — ABNORMAL LOW (ref 4.40–5.90)
RDW: 16.9 % — ABNORMAL HIGH (ref 11.5–14.5)

## 2012-11-04 LAB — CBC CANCER CENTER
Basophil #: 0 x10 3/mm (ref 0.0–0.1)
Eosinophil #: 0.4 x10 3/mm (ref 0.0–0.7)
Eosinophil %: 3.9 %
HGB: 7.8 g/dL — ABNORMAL LOW (ref 13.0–18.0)
Lymphocyte #: 1.3 x10 3/mm (ref 1.0–3.6)
Lymphocyte %: 12.6 %
MCH: 34.1 pg — ABNORMAL HIGH (ref 26.0–34.0)
MCHC: 34.8 g/dL (ref 32.0–36.0)
MCV: 98 fL (ref 80–100)
Monocyte #: 0.7 x10 3/mm (ref 0.2–1.0)
Monocyte %: 6.3 %
Neutrophil #: 8 x10 3/mm — ABNORMAL HIGH (ref 1.4–6.5)
Platelet: 126 x10 3/mm — ABNORMAL LOW (ref 150–440)
RBC: 2.29 10*6/uL — ABNORMAL LOW (ref 4.40–5.90)
WBC: 10.4 x10 3/mm (ref 3.8–10.6)

## 2012-11-04 LAB — COMPREHENSIVE METABOLIC PANEL
Albumin: 2.8 g/dL — ABNORMAL LOW (ref 3.4–5.0)
Alkaline Phosphatase: 129 U/L (ref 50–136)
Anion Gap: 13 (ref 7–16)
BUN: 12 mg/dL (ref 7–18)
Bilirubin,Total: 0.3 mg/dL (ref 0.2–1.0)
Calcium, Total: 8.4 mg/dL — ABNORMAL LOW (ref 8.5–10.1)
Co2: 25 mmol/L (ref 21–32)
Creatinine: 1.99 mg/dL — ABNORMAL HIGH (ref 0.60–1.30)
EGFR (African American): 37 — ABNORMAL LOW
EGFR (Non-African Amer.): 32 — ABNORMAL LOW
Glucose: 108 mg/dL — ABNORMAL HIGH (ref 65–99)
Osmolality: 282 (ref 275–301)
Potassium: 3.1 mmol/L — ABNORMAL LOW (ref 3.5–5.1)
SGOT(AST): 13 U/L — ABNORMAL LOW (ref 15–37)
SGPT (ALT): 15 U/L (ref 12–78)
Sodium: 141 mmol/L (ref 136–145)

## 2012-11-05 LAB — CULTURE, BLOOD (SINGLE)

## 2012-11-09 ENCOUNTER — Ambulatory Visit: Payer: Self-pay | Admitting: Oncology

## 2012-11-12 LAB — COMPREHENSIVE METABOLIC PANEL
Alkaline Phosphatase: 110 U/L (ref 50–136)
Anion Gap: 8 (ref 7–16)
BUN: 16 mg/dL (ref 7–18)
Bilirubin,Total: 0.3 mg/dL (ref 0.2–1.0)
Chloride: 107 mmol/L (ref 98–107)
Co2: 27 mmol/L (ref 21–32)
EGFR (African American): 47 — ABNORMAL LOW
EGFR (Non-African Amer.): 41 — ABNORMAL LOW
Glucose: 129 mg/dL — ABNORMAL HIGH (ref 65–99)
Osmolality: 286 (ref 275–301)
Potassium: 4.1 mmol/L (ref 3.5–5.1)
SGOT(AST): 14 U/L — ABNORMAL LOW (ref 15–37)
SGPT (ALT): 18 U/L (ref 12–78)
Total Protein: 6 g/dL — ABNORMAL LOW (ref 6.4–8.2)

## 2012-11-12 LAB — CBC CANCER CENTER
Basophil #: 0 x10 3/mm (ref 0.0–0.1)
Lymphocyte #: 1.1 x10 3/mm (ref 1.0–3.6)
Lymphocyte %: 24.6 %
MCH: 33.8 pg (ref 26.0–34.0)
MCHC: 34.4 g/dL (ref 32.0–36.0)
MCV: 98 fL (ref 80–100)
Monocyte %: 5.5 %
Neutrophil %: 68.3 %
RBC: 2.59 10*6/uL — ABNORMAL LOW (ref 4.40–5.90)

## 2012-11-26 LAB — CBC CANCER CENTER
Basophil %: 0.3 %
Eosinophil #: 0.2 x10 3/mm (ref 0.0–0.7)
Eosinophil %: 6.1 %
Lymphocyte %: 41.4 %
MCHC: 34.3 g/dL (ref 32.0–36.0)
Monocyte #: 0.2 x10 3/mm (ref 0.2–1.0)
Monocyte %: 8 %
Neutrophil #: 1.1 x10 3/mm — ABNORMAL LOW (ref 1.4–6.5)
Neutrophil %: 44.2 %
Platelet: 78 x10 3/mm — ABNORMAL LOW (ref 150–440)
RBC: 2.38 10*6/uL — ABNORMAL LOW (ref 4.40–5.90)
RDW: 17.4 % — ABNORMAL HIGH (ref 11.5–14.5)
WBC: 2.5 x10 3/mm — ABNORMAL LOW (ref 3.8–10.6)

## 2012-11-26 LAB — COMPREHENSIVE METABOLIC PANEL
Albumin: 3.2 g/dL — ABNORMAL LOW (ref 3.4–5.0)
Alkaline Phosphatase: 121 U/L (ref 50–136)
Anion Gap: 5 — ABNORMAL LOW (ref 7–16)
Calcium, Total: 8.5 mg/dL (ref 8.5–10.1)
Chloride: 103 mmol/L (ref 98–107)
EGFR (Non-African Amer.): 29 — ABNORMAL LOW
SGOT(AST): 13 U/L — ABNORMAL LOW (ref 15–37)
SGPT (ALT): 19 U/L (ref 12–78)
Sodium: 136 mmol/L (ref 136–145)
Total Protein: 6.5 g/dL (ref 6.4–8.2)

## 2012-12-03 LAB — CBC CANCER CENTER
Basophil #: 0 x10 3/mm (ref 0.0–0.1)
Eosinophil #: 0.1 x10 3/mm (ref 0.0–0.7)
HCT: 22.3 % — ABNORMAL LOW (ref 40.0–52.0)
HGB: 7.7 g/dL — ABNORMAL LOW (ref 13.0–18.0)
Lymphocyte #: 1.1 x10 3/mm (ref 1.0–3.6)
Lymphocyte %: 49.5 %
MCH: 34.5 pg — ABNORMAL HIGH (ref 26.0–34.0)
MCHC: 34.5 g/dL (ref 32.0–36.0)
MCV: 100 fL (ref 80–100)
Monocyte #: 0.3 x10 3/mm (ref 0.2–1.0)
Monocyte %: 14.1 %
Neutrophil #: 0.7 x10 3/mm — ABNORMAL LOW (ref 1.4–6.5)
Neutrophil %: 32.2 %
Platelet: 142 x10 3/mm — ABNORMAL LOW (ref 150–440)
RBC: 2.23 10*6/uL — ABNORMAL LOW (ref 4.40–5.90)
RDW: 19.3 % — ABNORMAL HIGH (ref 11.5–14.5)

## 2012-12-09 ENCOUNTER — Ambulatory Visit: Payer: Self-pay | Admitting: Oncology

## 2012-12-24 LAB — COMPREHENSIVE METABOLIC PANEL
Albumin: 3.5 g/dL (ref 3.4–5.0)
Alkaline Phosphatase: 117 U/L (ref 50–136)
BUN: 19 mg/dL — ABNORMAL HIGH (ref 7–18)
Bilirubin,Total: 0.7 mg/dL (ref 0.2–1.0)
Calcium, Total: 9.1 mg/dL (ref 8.5–10.1)
EGFR (African American): 53 — ABNORMAL LOW
EGFR (Non-African Amer.): 45 — ABNORMAL LOW
Osmolality: 281 (ref 275–301)
Potassium: 4.3 mmol/L (ref 3.5–5.1)
SGPT (ALT): 13 U/L (ref 12–78)
Total Protein: 6.7 g/dL (ref 6.4–8.2)

## 2012-12-24 LAB — CBC CANCER CENTER
Eosinophil %: 13 %
HGB: 10.1 g/dL — ABNORMAL LOW (ref 13.0–18.0)
Lymphocyte #: 1.4 x10 3/mm (ref 1.0–3.6)
Lymphocyte %: 22.2 %
MCH: 32.7 pg (ref 26.0–34.0)
MCHC: 33.4 g/dL (ref 32.0–36.0)
Neutrophil #: 3.4 x10 3/mm (ref 1.4–6.5)
Neutrophil %: 55.8 %
RBC: 3.08 10*6/uL — ABNORMAL LOW (ref 4.40–5.90)
RDW: 17.5 % — ABNORMAL HIGH (ref 11.5–14.5)
WBC: 6.1 x10 3/mm (ref 3.8–10.6)

## 2013-01-09 ENCOUNTER — Ambulatory Visit: Payer: Self-pay | Admitting: Oncology

## 2013-03-03 ENCOUNTER — Ambulatory Visit: Payer: Self-pay | Admitting: Oncology

## 2013-12-10 IMAGING — CT CT CHEST W/O CM
1 series · 14 of 33 positions shown, 18 images · non-contrast
Comparison: none

REASON FOR EXAM: pulmonary density
COMMENTS:

PROCEDURE:     CT  - CT CHEST WITHOUT CONTRAST  - September 28, 2011  [DATE]
RESULT:
Comparison is made to a prior study dated 06/21/2008.
TECHNIQUE: Helical noncontrast 5 mm sections were obtained from the thoracic
inlet through the lung bases.

[Series 2: soft tissue · axial · 0.79mm/px · z∈[-390,-86]mm · 14 of 73 slices shown, 18 images]
[im 6/73  mediastinal]
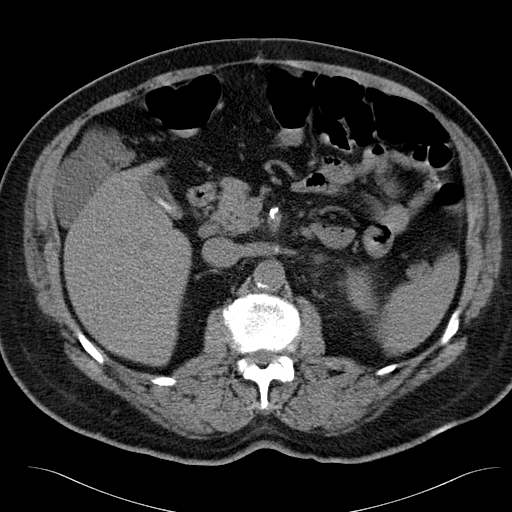
[im 6/73  lung]
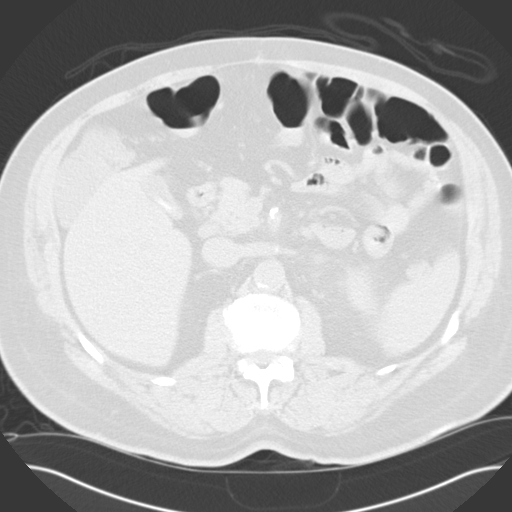
[im 11/73  lung]
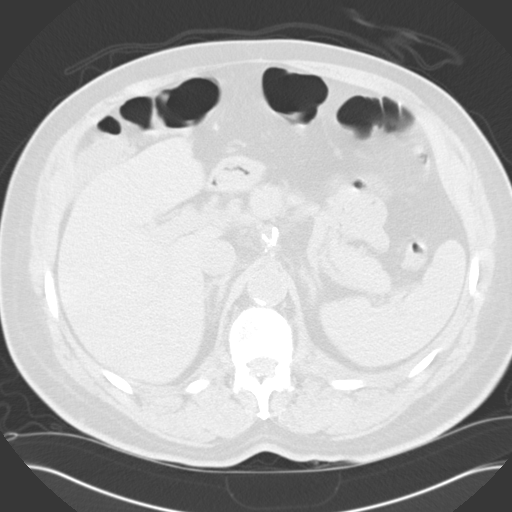
[im 15/73  lung]
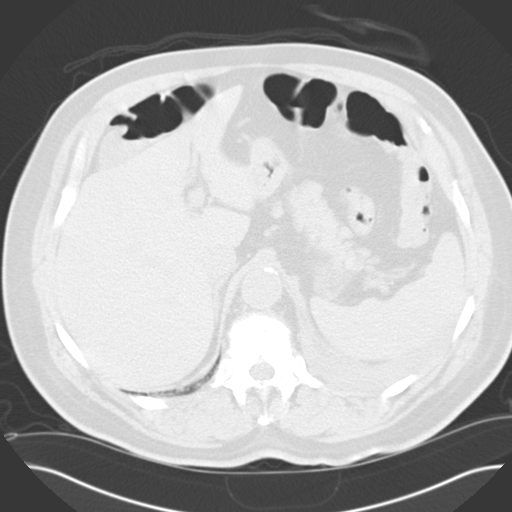
[im 19/73  lung]
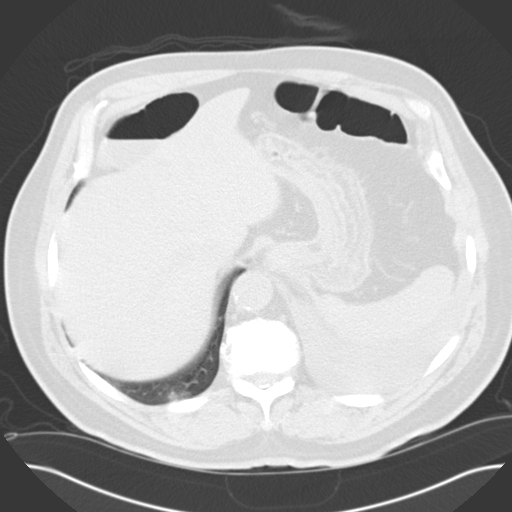
[im 25/73  mediastinal]
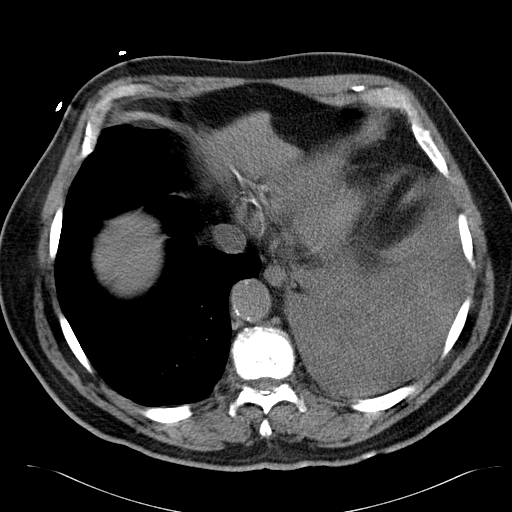
[im 25/73  lung]
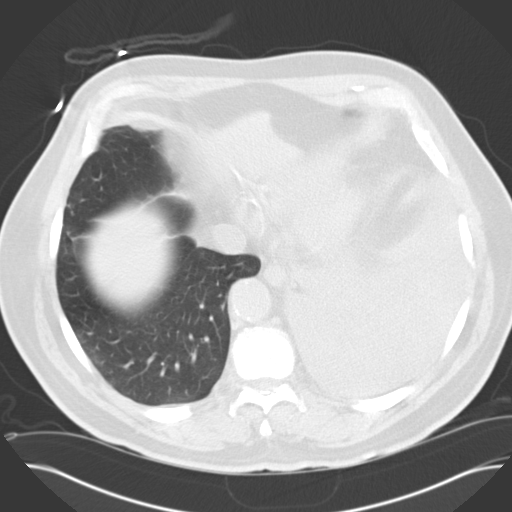
[im 30/73  lung]
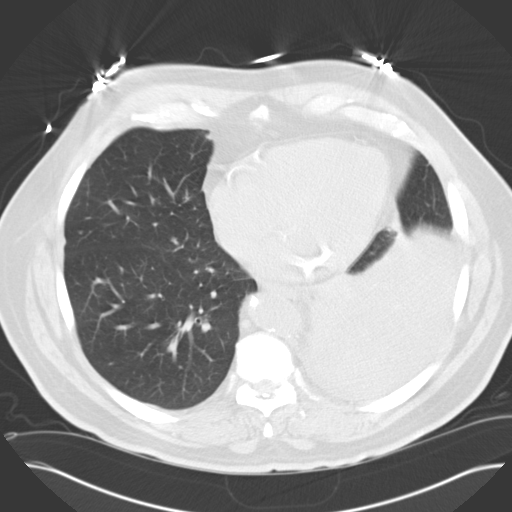
[im 35/73  lung]
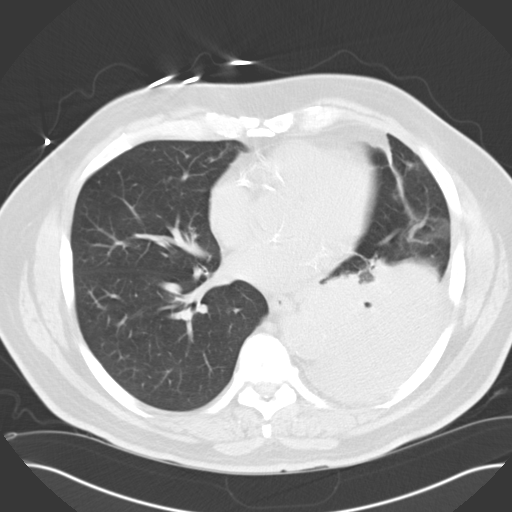
[im 39/73  lung]
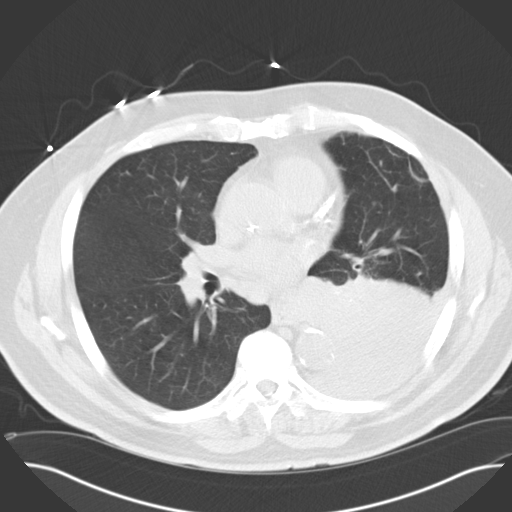
[im 43/73  mediastinal]
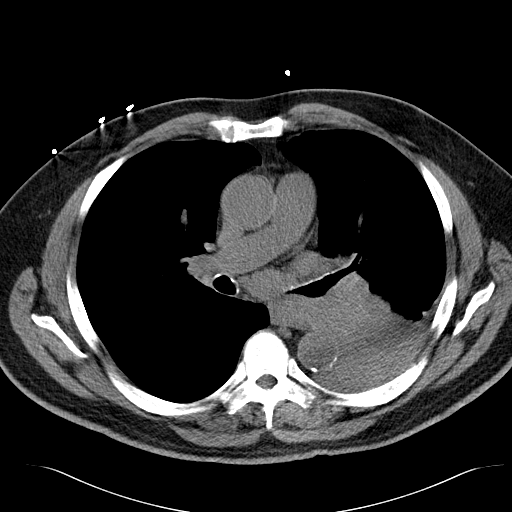
[im 43/73  lung]
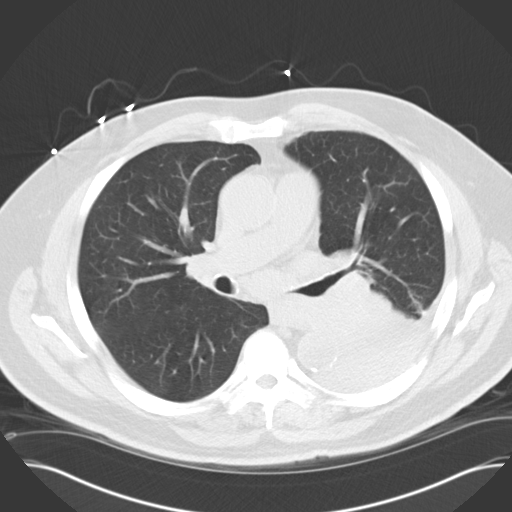
[im 49/73  lung]
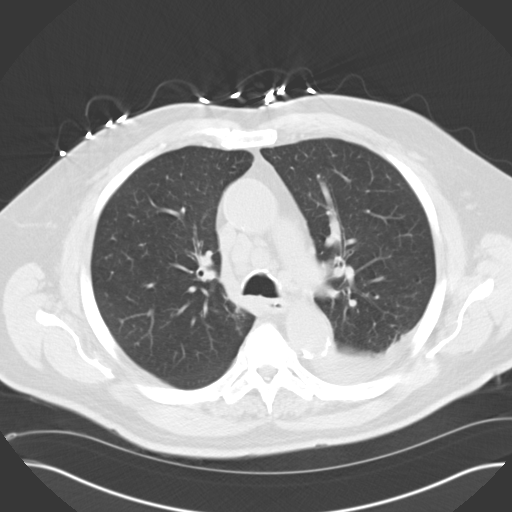
[im 54/73  lung]
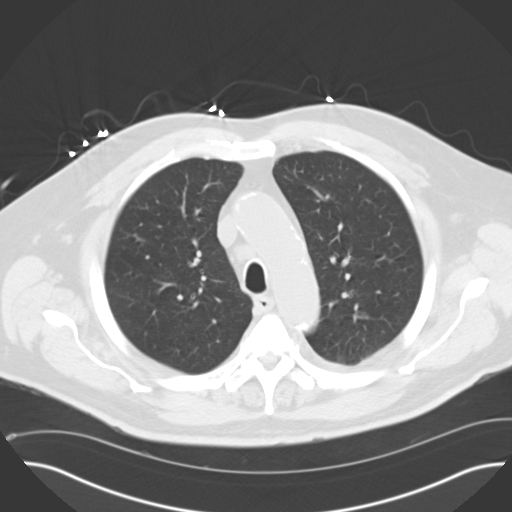
[im 58/73  lung]
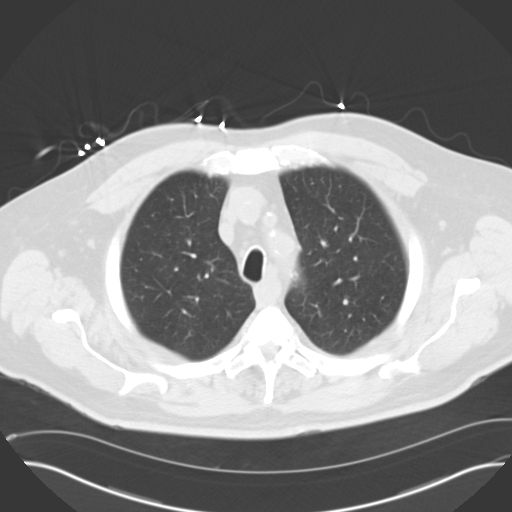
[im 62/73  mediastinal]
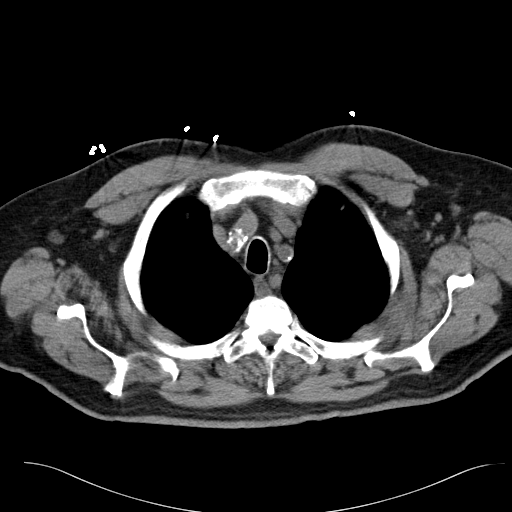
[im 62/73  lung]
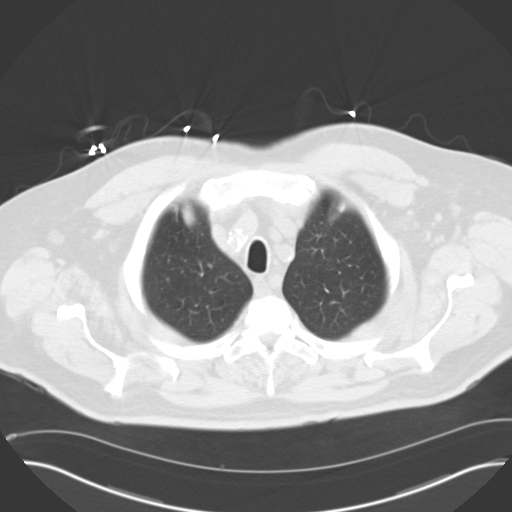
[im 67/73  lung]
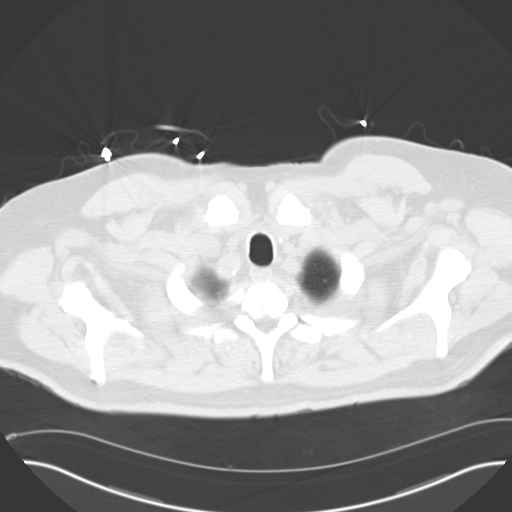

[14 of 33 positions shown; findings below may reference images not displayed]

FINDINGS: Mediastinal adenopathy is identified within the precarinal, pre
and paratracheal regions as well as the subcarinal region. The largest nodal
mass projects in the subcarinal region measuring 2.58 cm in narrowest
dimensions. There are findings concerning for a lymph node or mass adjacent
to the left paraesophageal region. This causes mass effect on the posterior
aspect of the left mainstem bronchus in its mid to distal portion. There are
findings concerning for extension of this mass into the left hemithorax.
There is diffuse consolidative density involving the left lower lobe and
compression and/or invasion of the lobe is of diagnostic concern.
Alternatively, a diffuse consolidative, non-neoplastic infiltrate, if
clinically appropriate, cannot be excluded. The lung parenchyma otherwise
demonstrates no further focal regions of consolidation or focal masses or
infiltrates. A small, 5.3 mm nodular density projects within the medial
portion of the apex of the left lower lobe just adjacent to the descending
aorta. The visualized upper abdominal viscera demonstrate dependent
calcifications within the gallbladder. There is Ch[place]es appreciated.
There are findings which may represent fluid within the ascending colon at
the region of the hepatic flexure. Alternatively, a mass within this area
cannot be completely excluded. If clinically warranted, further evaluation
with CT of the abdomen and pelvis is recommended. There is no evidence of an
abdominal aortic aneurysm.
IMPRESSION: 1.  Findings concerning for a mediastinal mass with compression and/or
possible invasion of the mid to distal left mainstem bronchus as well as
compression and/or invasion of the left lower lobe bronchus.
2.  There is mediastinal adenopathy. Collapsed consolidative lung is
appreciated primarily involving the left lower lobe.
3.  Questionable mass along the anterior aspect of the lower portion of the
liver. Alternatively, this may represent a loop of fluid-filled bowel.

## 2014-02-22 IMAGING — CT CT CHEST W/ CM
1 series · 15 of 32 positions shown, 19 images · IV contrast (agent unspecified)
Comparison: none

REASON FOR EXAM: Include Adrenal Glands  lung CA restaging after chemo
COMMENTS:

PROCEDURE:     CT  - CT CHEST WITH CONTRAST  - December 11, 2011  [DATE]
RESULT:
TECHNIQUE: Chest CT is performed utilizing 75 ml of [id number] iodinated
intravenous contrast. There is poor opacification of the pulmonary arteries.
Images are reconstructed at 3.0 mm slice thickness in the axial plane and
compared to images from 28 September, 2011.

[Series 2: soft tissue · axial · 0.76mm/px · z∈[-772,-469]mm · 15 of 113 slices shown, 19 images]
[im 8/113  soft-tissue]
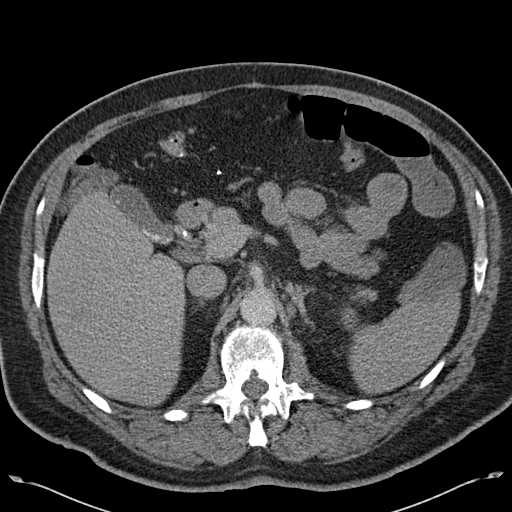
[im 8/113  bone]
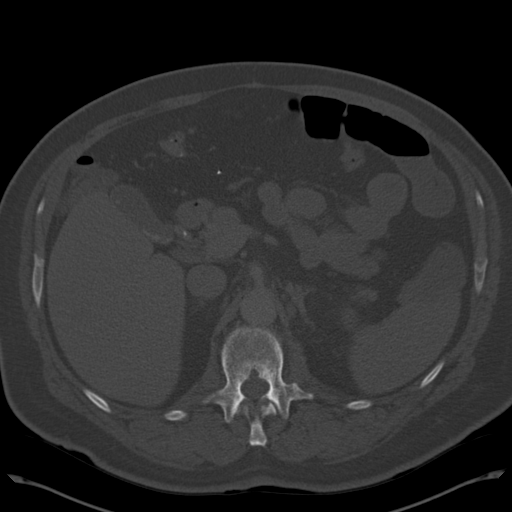
[im 15/113  soft-tissue]
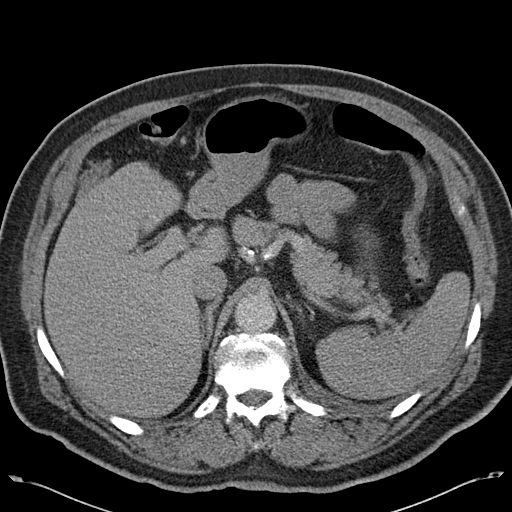
[im 22/113  soft-tissue]
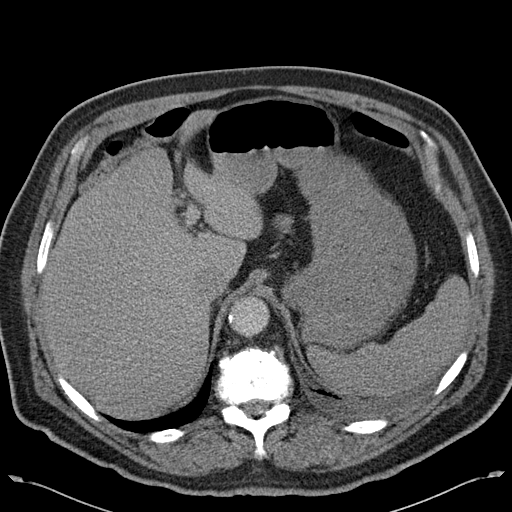
[im 33/113  soft-tissue]
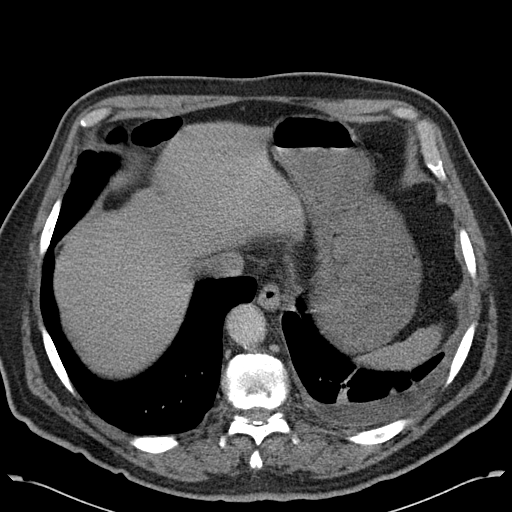
[im 40/113  soft-tissue]
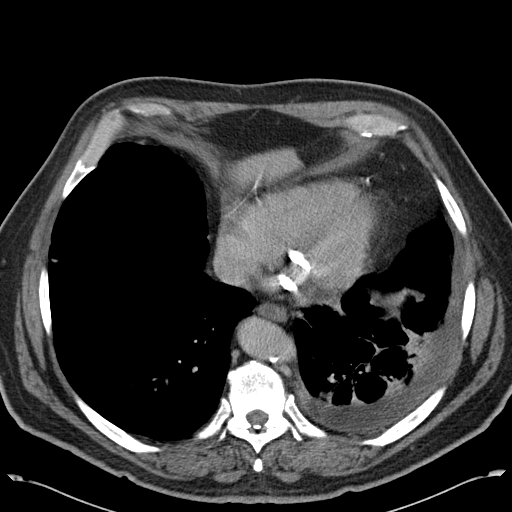
[im 47/113  soft-tissue]
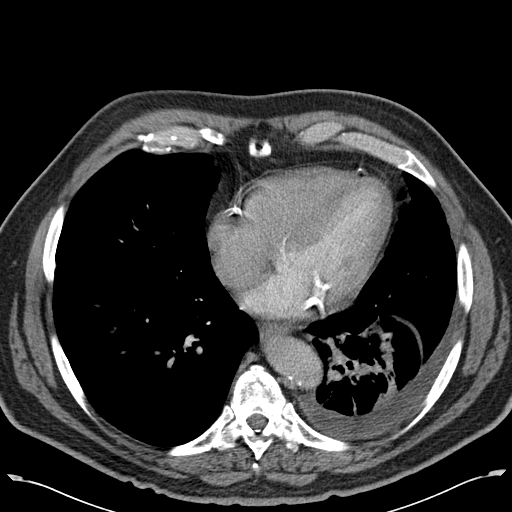
[im 58/113  soft-tissue]
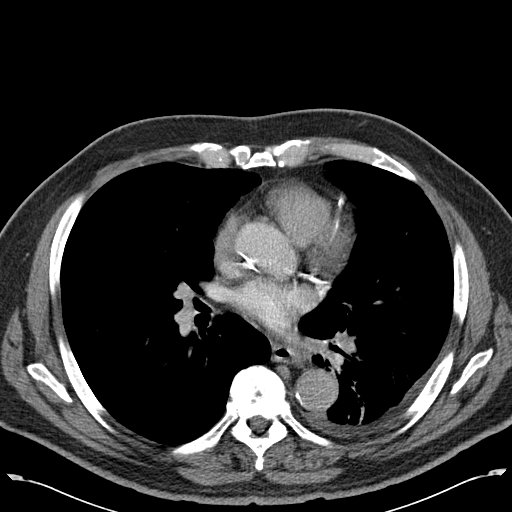
[im 66/113  soft-tissue]
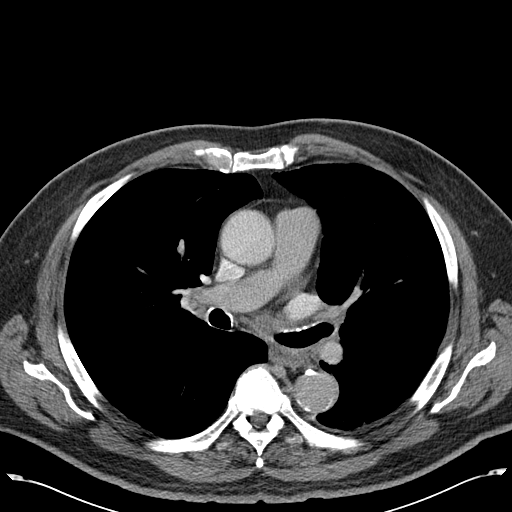
[im 73/113  soft-tissue]
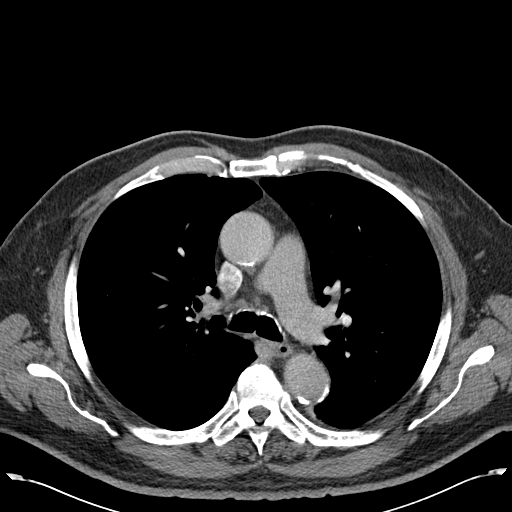
[im 73/113  bone]
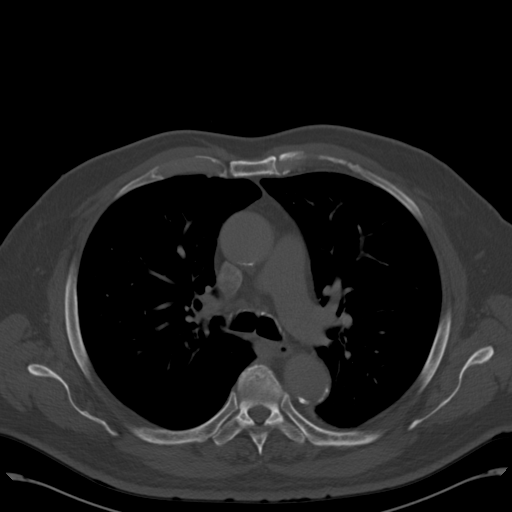
[im 80/113  soft-tissue]
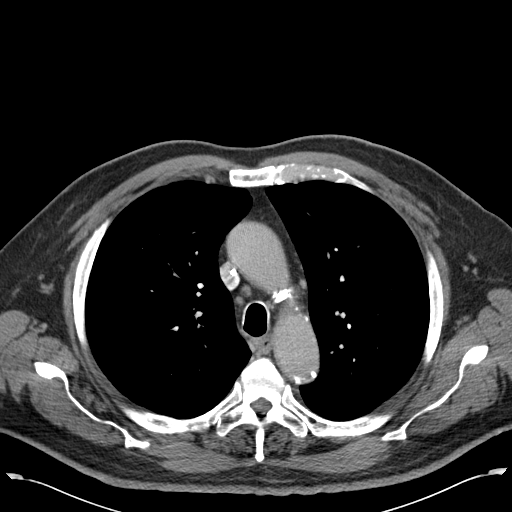
[im 91/113  soft-tissue]
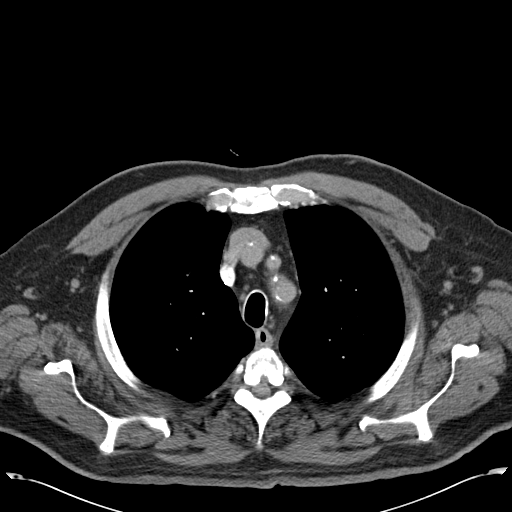
[im 98/113  soft-tissue]
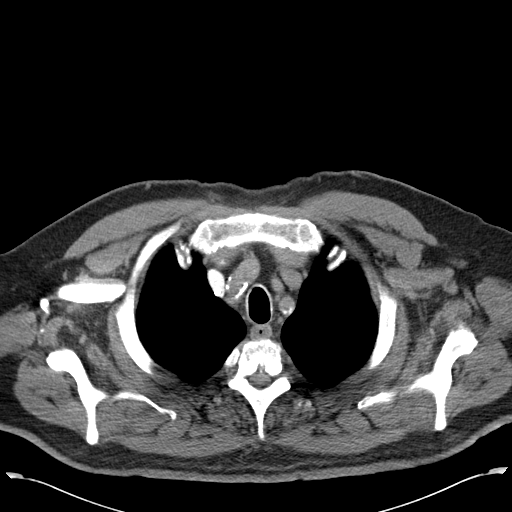
[im 98/113  lung]
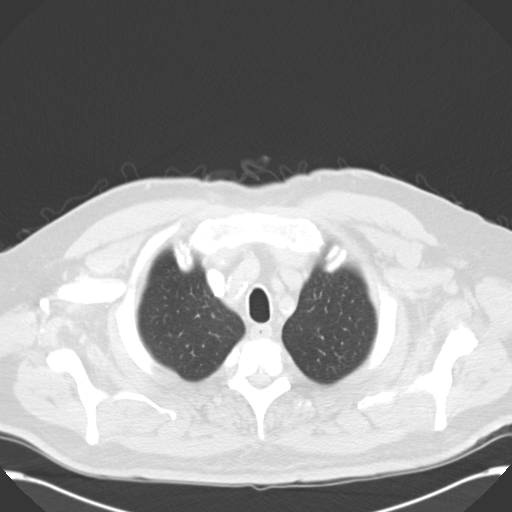
[im 102/113  lung]
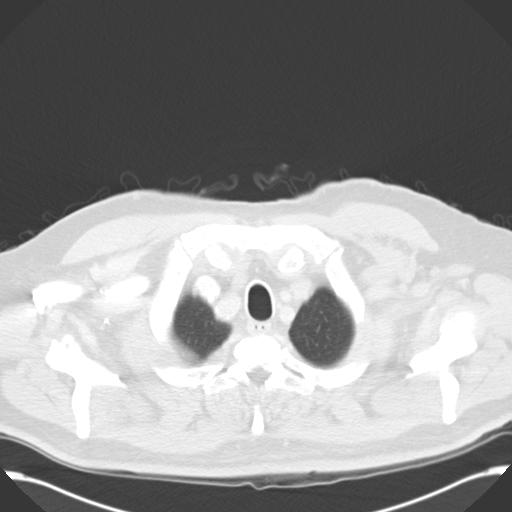
[im 105/113  soft-tissue]
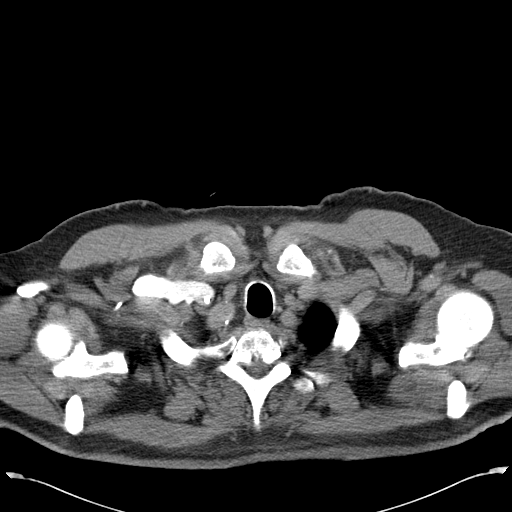
[im 105/113  lung]
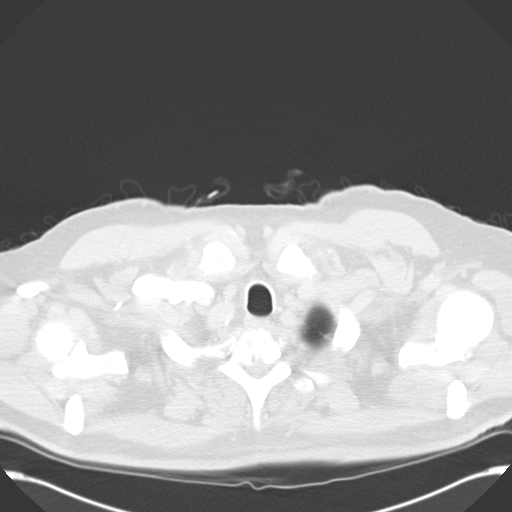
[im 109/113  lung]
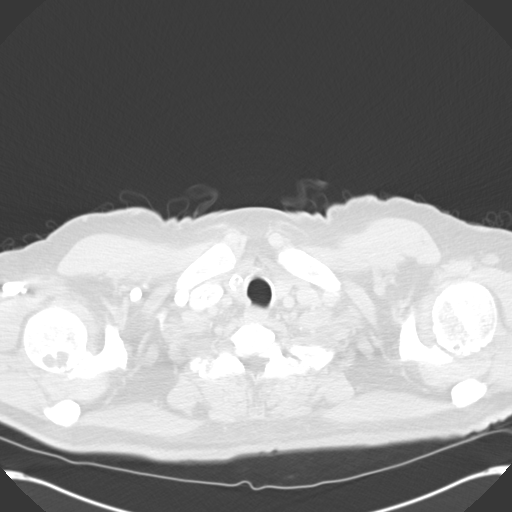

[15 of 32 positions shown; findings below may reference images not displayed]

FINDINGS: There is a small, left pleural effusion which has decreased in
size since the previous exam. Pretracheal and paratracheal lymph nodes are
seen. The largest appears to be on image 36 just to the right of midline in
the pretracheal region with a short axis of 9.2 mm. Shotty, para-aortic and
AP window lymph nodes are seen. There is some consolidation in the left
lower lobe with improved aeration compared to the previous exam. This could
be secondary to atelectasis or infiltrate. Some peribronchial thickening is
seen in the infrahilar region in the left lower lobe. A well defined mass is
not identified. The remaining portions of the lungs appear clear. There is a
3.7 mm diameter, smoothly marginated nodule in the right lower lobe on image
57 which appears to be unchanged. The right and left adrenal gland appears
to be normal without a focal nodule. The other included upper abdominal
structures demonstrate small stones in the gallbladder and atherosclerotic
calcification in the aorta and its branches. The bony structures appear to
be preserved in height in the spine with focal sclerotic areas along the
vertebral end-plates with some degenerative disc space narrowing and
marginal hypertrophic spurring consistent with degenerative end-plate
disease.
IMPRESSION: 1.     Improving aeration in the left lower lobe. Small residual left
pleural effusion.
2.     Some prominent mediastinal lymph nodes that are not pathologically
enlarged by size criteria. No adrenal mass.
3.     Minimal nodularity in the right lower lobe.
4.     Overall improved appearance. Follow-up PET/CT may be beneficial. This
could help better localize any residual metabolically hyperactive malignant
tissue.

[REDACTED]

## 2014-10-01 NOTE — H&P (Signed)
  DATE OF BIRTH:  1934/01/18  DATE OF ADMISSION:  07/26/2012  CHIEF COMPLAINT: Syncope.   Please refer to the consult dictated previously today.   The patient had another syncopal episode prior to discharge in the Emergency Room. Presently blood pressure is 92/57. The patient had a bout of diarrhea prior to the syncopal episode.   Will admit the patient for observation on a telemetry floor. Will continue IV fluid resuscitation. The patient had chemo one week prior. Did not have any diarrhea, but an episode today with neutropenia. Will send for stool cultures along with C. diff. Also Oncology consult for pancytopenia. Repeat orthostatics in the morning.   Time spent today on this case was 65 minutes.     ____________________________ Molinda BailiffSrikar R. Romey Cohea, MD srs:mr D: 07/26/2012 18:03:00 ET T: 07/26/2012 19:05:09 ET JOB#: [id number]  cc: Wardell HeathSrikar R. Hercules Hasler, MD, <Dictator> Marisue IvanKanhka Linthavong, MD Tollie Pizzaimothy J. Orlie DakinFinnegan, MD Gerome SamJanak K. Doylene Canninghoksi, MD    Orie FishermanSRIKAR R Amaura Authier MD ELECTRONICALLY SIGNED 07/30/2012 5:15

## 2014-10-01 NOTE — Discharge Summary (Signed)
PATIENT NAME:  Joel DroughtWILSON, [name] MR#:  [id number] DATE OF BIRTH:  23-Jan-1934  DATE OF ADMISSION:  07/26/2012 DATE OF DISCHARGE:  07/28/2012  DISCHARGE DIAGNOSES: 1.  Syncope secondary to orthostatic hypotension.  2.  Clostridium difficile.  3.  Pancytopenia.  4.  Small cell lung cancer.  5.  Anemia.  6.  History of coronary artery disease.  7.  Hyperlipidemia.  8.  Hypothyroidism.  9.  Hypertension.  10.  Borderline diabetes. 11.  History of gout.   DISCHARGE MEDICATIONS: 1.  Imodium every 4 hours as needed as directed.  2.  Zolpidem 10 mg p.o. at bedtime p.r.n. for insomnia.  3.  Aspirin 81 mg p.o. daily.  4.  Ferrous sulfate 325 mg p.o. daily.  5.  Atorvastatin 20 mg p.o. at bedtime.  6.  Synthroid 175 mcg p.o. daily.  7. Metronidazole 500 mg p.o. t.i.d. x 13 more days.   MEDICATIONS TO HOLD:  Furosemide and lisinopril.   CONSULTS:  Oncology per Dr. Orlie DakinFinnegan.  STUDIES:  None.   PERTINENT LABORATORY AND DIAGNOSTIC DATA: C. difficile was positive.  On the day of discharge sodium 141, potassium 4.2, creatinine 1.32, glucose 114. White blood cell count 1.3, hemoglobin 8.3, platelets 146.   BRIEF HOSPITAL COURSE:    PROBLEM #1: Syncope secondary to orthostatic hypotension. The patient had had a history of diarrhea, but appears to have these syncopal episodes but he did not realize was occurring as he was evaluated in Emergency Room. When he was evaluated it was consistent with hypotension and he was given IV fluids; his diuretic and his ACE inhibitor were held. His symptoms resolved. He was able to get out of bed without any issues on day of discharge.   PROBLEM #2:  C. difficile.  He came back positive for C. difficile x 2. He was placed on Flagyl 500 mg t.i.d. x 13 more days with total of 14 days of therapy.   PROBLEM #3:  Anemia. The patient has history of pancytopenia with history of chemotherapy for his small cell lung cancer. He was transfused 1 unit and hemoglobin went up  from 6.8 to 8.3 on day of discharge.  He is asymptomatic otherwise.   Other chronic medical issues remained stable. No changes in his current regimen.   DISPOSITION: He is in stable condition and will be discharged to home. Follow-up with Dr. Burnadette PopLinthavong within 10 days, follow with oncology as scheduled.     ____________________________ Marisue IvanKanhka Roscoe Witts, MD kl:ct D: 07/28/2012 12:46:21 ET T: 07/28/2012 14:05:57 ET JOB#: [id number]  cc: Marisue IvanKanhka Mouhamad Teed, MD, <Dictator> Marisue IvanKanhka Murlene Revell, MD Tollie Pizzaimothy J. Orlie DakinFinnegan, MD Marisue IvanKANHKA Emmalie Haigh MD ELECTRONICALLY SIGNED 08/05/2012 14:21

## 2014-10-01 NOTE — Consult Note (Signed)
History of Present Illness:  Reason for Consult Stage IV small cell lung cancer on chemotherapy now with orthostatic hypotension and syncope.   HPI   Patient is a 81 year old male who is undergoing treatment for his stage IV small cell lung cancer with carboplatinum and irinotecan.  Yesterday patient stat he felt dizzy and had a syncopal episode.  Despite increasing his fluid intake, he did not feel any better and came into the emergency room.  After 2 L of fluids, his symptoms did not resolve and he was admitted overnight for further treatment and evaluation.  Currently, he feels improved, but not back to his baseline.  He has no neurologic complaints.  He denies any recent fevers or illnesses.  He has no chest pain or shortness of breath.  He has a good appetite and denies any nausea, vomiting, constipation, or diarrhea.  He has no urinary complaints.  Patient otherwise feels well and offers no further specific compaints.   PFSH:  Family History noncontributory   Comments lives alone, works for [employer]   Social History negative alcohol   Comments social occ.alcohol ,non drinker for past 6 months   Additional Past Medical and Surgical History 1. [id number] of coronary artery disease and was seen at [facility] for CABG he was started on Lasix and become blockers lisinopril he continued on estrogen during his workup for Date she was found to have right.   2. Left-sided pleural effusion and underwent workup he is found to have new left hypermetabolic mass  and mediastinal left supraclavicular lymph nodes   3. History of malignant colon polyp status post resection 4. Operative procedure bronchoscopy and 10/02/2011 days  concerning for primary lung malignancy,awaiting pathology report status post bronchoscopy   Review of Systems:  General fatigue   Performance Status (ECOG) 0   Review of Systems   As per HPI. Otherwise, 10 point system review was negative.   NURSING NOTES:   **Vital Signs.:   16-Feb-14 09:57   Vital Signs Type: Q 4hr   Temperature Temperature (F): 97.8   Celsius: 36.5   Temperature Source: oral   Pulse Pulse: 88   Respirations Respirations: 20   Systolic BP Systolic BP: 330   Diastolic BP (mmHg) Diastolic BP (mmHg): 63   Pulse Pulse Sitting: 88   Systolic BP Systolic BP: 73   Diastolic BP (mmHg) Diastolic BP (mmHg): 46   Pulse Standing Pulse Standing: 93   Pulse Ox % Pulse Ox %: 95   Pulse Ox Activity Level: At rest   Oxygen Delivery: Room Air/ 21 %   Physical Exam:  Physical Exam General: Well-developed, well-nourished, no acute distress. Eyes: Pink conjunctiva, anicteric sclera. HEENT: Normocephalic, moist mucous membranes, clear oropharnyx. Lungs: Clear to auscultation bilaterally. Heart: Regular rate and rhythm. No rubs, murmurs, or gallops. Abdomen: Soft, nontender, nondistended. No organomegaly noted, normoactive bowel sounds. Musculoskeletal: No edema, cyanosis, or clubbing. Neuro: Alert, answering all questions appropriately. Cranial nerves grossly intact. Skin: No rashes or petechiae noted. Psych: Normal affect. Lymphatics: No cervical, calvicular, axillary or inguinal LAD.    No Known Allergies:       Imodium A-D 2 mg oral tablet: 1 tab(s) orally every 4 hours, As Needed- for Diarrhea , Status: Active, Quantity: 0, Refills: None   zolpidem 10 mg oral tablet: 1 tab(s) orally once a day (at bedtime) as needed sleep., Status: Active, Quantity: 0, Refills: None   Aspirin Enteric Coated 81 mg oral delayed release tablet: 1 tab(s) orally once  a day, Status: Active, Quantity: 0, Refills: None   ferrous sulfate 325 mg oral tablet: 1 tab(s) orally once a day., Status: Active, Quantity: 0, Refills: None   atorvastatin 20 mg oral tablet: 1 tab(s) orally once a day (at bedtime), Status: Active, Quantity: 0, Refills: None   Synthroid 175 mcg (0.175 mg) oral tablet: 1 tab(s) orally once a day, Status: Active,  Quantity: 0, Refills: None  Laboratory Results:  Routine Chem:  16-Feb-14 05:20   Result Comment LABS - This specimen was collected through an   - indwelling catheter or arterial line.  - A minimum of 38ms of blood was wasted prior    - to collecting the sample.  Interpret  - results with caution. wbc - RESULTS VERIFIED BY REPEAT TESTING. - CRITICAL VALUE PREVIOUSLY NOTIFIED.  Result(s) reported on 27 Jul 2012 at 07:23AM.  Glucose, Serum  116  BUN 17  Creatinine (comp)  1.44  Sodium, Serum 142  Potassium, Serum 4.1  Chloride, Serum  108  CO2, Serum 27  Calcium (Total), Serum  8.0  Anion Gap 7  Osmolality (calc) 286  eGFR (African American)  54  eGFR (Non-African American)  47 (eGFR values <6107mmin/1.73 m2 may be an indication of chronic kidney disease (CKD). Calculated eGFR is useful in patients with stable renal function. The eGFR calculation will not be reliable in acutely ill patients when serum creatinine is changing rapidly. It is not useful in  patients on dialysis. The eGFR calculation may not be applicable to patients at the low and high extremes of body sizes, pregnant women, and vegetarians.)  Routine Hem:  16-Feb-14 05:20   WBC (CBC)  1.0  RBC (CBC)  2.47  Hemoglobin (CBC)  6.8  Hematocrit (CBC)  21.7  Platelet Count (CBC) 155  MCV 88  MCH 27.7  MCHC  31.4  RDW 14.0  Segmented Neutrophils 13  Lymphocytes 72  Variant Lymphocytes 5  Monocytes 6  Eosinophil 3  Basophil 1  Diff Comment 1 ANISOCYTOSIS  Diff Comment 2 POIKILOCYTOSIS  Diff Comment 3 POLYCHROMASIA  Diff Comment 4 PLTS VARIED IN SIZE  Result(s) reported on 27 Jul 2012 at 07:23AM.   Assessment and Plan: Impression:   1. 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 Plan:   1. 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  He has been instructed to keep his  previously scheduled followup appointment on Wednesday for further evaluation and consideration of his next infusion. Dehydration/acute renal failure: Improved with fluid resuscitation.  Patient is symptomatically improved as well.  Continue IV fluids as ordered. Anemia: Secondary to chemotherapy.  Agree with 1 unit of packed red blood cells. Neutropenia: Also secondary to chemotherapy, monitor. consult, will follow.  Electronic Signatures: FiDelight HohMD)  (Signed 16-Feb-14 13:30)  Authored: HISTORY OF PRESENT ILLNESS, PFSH, ROS, NURSING NOTES, PE, ALLERGIES, HOME MEDICATIONS, LABS, ASSESSMENT AND PLAN   Last Updated: 16-Feb-14 13:30 by FiDelight HohMD)

## 2014-10-01 NOTE — Consult Note (Signed)
PATIENT NAME:  Joel Schultz, Joel Schultz MR#:  [id number] DATE OF BIRTH:  03-27-1934  DATE OF CONSULTATION:  07/26/2012  Primary care physician - Dr. Burnadette Pop  CONSULTING PHYSICIAN:  Joel Brim R. Floride Hutmacher, MD  REASON FOR CONSULTATION:  Orthostatic syncope.   ADMITTING PHYSICIAN:  Dr. Clemens Catholic of Emergency Room.   History obtained from patient, old records reviewed. Imaging studies reviewed personally. I discussed the case with Dr. Brien Mates and Dr. Clemens Catholic of ER. The case also discussed with Dr. Orlie Dakin of Cancer Center.   HISTORY OF PRESENTING ILLNESS: A 81 year old male patient with history of coronary artery disease, hypertension, left lung cancer, hypothyroidism undergoing chemotherapy presented to the Emergency Room after having an episode of syncope. The patient was sitting down, stood up to switch on light from the light switch and passed out. He immediately woke up, did not have any seizures or incontinence. The patient has been feeling dizzy all through the week. He also had borderline low blood pressure at the [facility] during his recent appointment. His lisinopril was cut from 10 mg to 5 mg. His creatinine is at 2.08 and it was 2.07 ten days back as checked at the [facility]. He does have CKD with baseline creatinine of 1.6. The patient was given 2 liters of IV fluids in the Emergency Room, feels significantly better and I personally checked his blood pressure which is 122/59 at this time and pulse of 72 and patient stood up without any dizziness and walked around fine. No chest pain, shortness of breath or lightheadedness at this time. He does have neutropenia of 5.8, case was discussed with Dr. Orlie Dakin who suggested follow up at the clinic and his neutropenia secondary to his chemotherapy which was done 1 week back.   PAST MEDICAL HISTORY: 1.  Left lung cancer.  2.  Hypertension.  3.  Coronary artery disease.  4.  Hyperlipidemia.  5.  Gout.  6.  Hypothyroidism.  7.  Colon polyps.  8.   Borderline diabetes mellitus.  9.  Deficiency anemia.  SOCIAL HISTORY: The patient lives alone. Does not smoke. No alcohol. No illicit drugs.   HOME MEDICATIONS:  Include: 1.  Aspirin 81 mg oral daily.  2.  Lipitor 20 mg oral daily.  3.  Lisinopril 5 mg oral daily.  4.  Lasix 20 mg every 2 days for edema.  5.  Multivitamin 1 tablet oral daily.  6.  Synthroid 175 mcg oral daily.   ALLERGIES:  No known drug allergies.   FAMILY HISTORY: Father had a history of coronary artery disease and stomach cancer. Mother had emphysema.   REVIEW OF SYSTEMS: CONSTITUTIONAL:  Had some fatigue but feels much better at this time. No weight loss, weight gain.  EYES:  No blurred vision, pain or redness.  ENT:  No tinnitus, ear pain, hearing loss.  RESPIRATORY:  No cough, wheeze, hemoptysis or dyspnea.  CARDIOVASCULAR:  No chest pain, orthopnea, edema. Had syncope, which has resolved.  GASTROINTESTINAL:  No nausea, vomiting, diarrhea, abdominal pain.  GENITOURINARY: No dysuria, hematuria.  ENDOCRINE:  No polyuria or nocturia. Does have hypothyroidism. HEMATOLOGIC AND LYMPHATIC:  Has anemia and neutropenia.  INTEGUMENTARY:  No acne, rash, lesions.  MUSCULOSKELETAL:  No joint swelling, redness, effusion.  NEUROLOGIC: No numbness, weakness, dysarthria. PSYCHIATRIC:   No anxiety or depression. Does have insomnia.   PHYSICAL EXAMINATION: VITAL SIGNS: Temperature 97.6, pulse of 97. Initial blood pressure was 87/47 but after IV fluid resuscitation is 122/59. Saturating 96% on room air. GENERAL:  Obese, Caucasian male patient sitting up in bed, comfortable, conversational, cooperative with examination.  PSYCHIATRIC:  Alert, oriented x 3. Mood and affect appropriate. Judgment intact.   HEENT: Atraumatic, normocephalic. Oral mucosa dry and pink. Pallor positive. No icterus. Pupils bilaterally equal and react to light.  NECK:  Supple. No thyromegaly. No palpable lymph nodes. Trachea midline. No carotid bruit,  JVD.  CARDIOVASCULAR:  S1, S2, no edema. Peripheral pulses 2+.  RESPIRATORY:  Normal work of breathing. Clear to auscultation on both sides.  GASTROINTESTINAL: Soft abdomen, nontender, bowel sounds present. No hepatosplenomegaly palpable.  SKIN: Warm and dry. No petechiae, rash, ulcers.  MUSCULOSKELETAL:  No joint swelling, redness, effusion of the large joints. Normal muscle tone.  NEUROLOGICAL: Motor strength 5 out of 5 in upper and lower extremities. Sensation to fine touch intact all over. Romberg sign negative. Babinski's downgoing on both sides. Cranial nerves II through XII intact.  LYMPHATIC: No cervical lymphadenopathy.  LABORATORY STUDIES:  Show glucose of 155, BUN 20, creatinine 2.08, sodium 137, potassium 4. CK of 37. Troponin less than 0.02. WBC 0.8, hemoglobin 8.6, platelets of 203.   Urinalysis shows no bacteria.   CT scan of the head without contrast showed no acute abnormalities.   Chest x-ray, PA and lateral, shows left lower lobe mass where he has cancer.   EKG shows no acute ST or T wave elevation or depression.   ASSESSMENT AND PLAN: 1.  Orthostatic syncope secondary to dehydration and patient being on Lasix. The patient feels better. Blood pressure is improved to 122/59 after 2 liters of IV fluids. I have advised the patient to stop the Lasix. Orthostatics negative at this time. Also stop the lisinopril as this has been cut to half one week back. His cardiac enzymes are normal. CT head shows no acute abnormalities. The patient has stood up for me without any lightheadedness and walked around feeling well after IV fluid resuscitation. Will be discharged home and Lasix and lisinopril to be stopped.  2.  Chronic kidney disease:  The patient does have mild elevation of creatinine which seems to be stable from 10 days back. Stop the lisinopril and Lasix. He will need repeat BMP when he visits the Cancer Center.  3.  Pancytopenia with neutropenia:  The patient is afebrile. No  signs of infection. Case discussed with Dr. Orlie DakinFinnegan. The patient will follow up with the Cancer Center. His pancytopenia should improve once the chemotherapy is stopped. I advised the patient to return to the Emergency Room if he has any fever or bleeding.  4.  Coronary artery disease:  Stable.  5.  Hypertension:  The patient is normotensive at this time, lisinopril being stopped.  6.  Lung cancer:  Follow up with Cancer Center.   CODE STATUS:  FULL CODE.   Time spent today on this consult was 50 minutes. Case was discussed with ER physician, Dr. Clemens Catholicagsdale prior to discharge. Case also discussed with Dr. Orlie DakinFinnegan through ER.     ____________________________ Molinda BailiffSrikar R. Lonette Stevison, MD srs:ce D: 07/26/2012 17:13:45 ET T: 07/26/2012 17:42:20 ET JOB#: [id number]  cc: Wardell HeathSrikar R. Maleka Contino, MD, <Dictator>  Dr. Burnadette PopLinthavong Dr. Doylene Canninghoksi Dr. Stephens ShireFinnegan   Trystin Terhune R Davari Lopes MD ELECTRONICALLY SIGNED 07/26/2012 20:15

## 2014-10-03 NOTE — Discharge Summary (Signed)
PATIENT NAME:  Joel Schultz, Joel Schultz DATE OF BIRTH:  05-11-1934  DATE OF ADMISSION:  09/27/2011 DATE OF DISCHARGE:  10/03/2011  DISCHARGE DIAGNOSES:  1. New hypermetabolic left lower lobe mass and mediastinal/left supraclavicular lymph nodes.  2. Acute renal failure, resolved.  3. Hyperkalemia, resolved.  4. Hypertension.  5. Coronary artery disease.  6. Hyperlipidemia. 7. Gout.  8. Hypothyroidism.  9. History of malignant colon polyp, status post resection.  10. Borderline diabetes.  11. Iron deficiency anemia.  DISCHARGE MEDICATIONS:  1. MiraLax 17 grams p.o. daily p.r.n. for constipation.  2. Synthroid 200 mcg p.o. daily.  3. Lipitor 20 mg p.o. daily.  4. Lisinopril 10 mg p.o. daily.  5. Aspirin 81 mg p.o. daily.  6. Ferrous sulfate 142 mg p.o. daily.   CONSULTS:  1. Nephrology per Dr. Thedore MinsSingh.  2. Cardiology per Dr. Gwen PoundsKowalski. 3. Pulmonology per Dr. Welton FlakesKhan.   PROCEDURES: The patient underwent a bronchoscopy per Dr. Belia HemanKasa on 10/03/2011. The results are still pending.   PERTINENT LABORATORY, DIAGNOSTIC AND RADIOLOGICAL DATA:  The patient had a chest CT that identified a left lower lobe mass.  The patient had a PET scan that showed hypermetabolic left infrahilar mass concerning for malignancy with obstruction of left lower lobe bronchus, also showed hypermetabolic mediastinal and left supraclavicular lymph nodes. On the day of discharge: Sodium 130, potassium 4.4, BUN 17, creatinine 1.22, and glucose 109. White blood cell count 9.8, hemoglobin 8.4, platelets of 414, MCV of 76. Iron studies were positive for iron deficiency.   BRIEF HOSPITAL COURSE:  1. New left lower lobe mass concerning for malignancy: The patient initially came in and had a chest x-ray that showed possible pneumonia/density in the left lower lobe. He underwent a CT of the chest because he was asymptomatic from this process except for mild shortness of breath. The chest CT was concerning for malignancy.  He underwent a PET scan that showed a hypermetabolic left infrahilar mass concerning for malignancy, also showed obstruction of left lower lobe bronchus and hypermetabolic mediastinal left supraclavicular lymph nodes. Pulmonology was consulted, who recommended bronchoscopy. He underwent bronchoscopy per Dr. Belia HemanKasa on 10/02/2011 which showed per Dr. Belia HemanKasa that the left lower lung was blocked, also concerning for malignancy. Samples were taken, and those results are still pending at this time. We will need to get him set up with Dr. Doylene Canninghoksi or Oncology as an outpatient this week. Our plan is to call them today and discuss the patient.  2. Acute renal failure: The patient initially came in with acute renal failure with a creatinine of 3.1 and potassium above 6.0. On repeat labs, his creatinine came down to 2.93, and potassium was 5.1. He was aggressively hydrated, and his renal function improved with hydration. Lisinopril was held at that time. Nephrology was consulted and did renal studies that were essentially negative. On the day of discharge, his renal function returned to within normal limits and electrolytes were stable except for a sodium of 130, but potassium was 4.4. We will plan to restart lisinopril upon discharge.  3. History of coronary artery disease, three vessel disease: He had plans for CABG [facility]; however, with this new finding we will delay that.  Dr. Gwen PoundsKowalski was consulted and recommended medical management at this time. The patient is asymptomatic. We will continue with aspirin and lisinopril. He is intolerant to beta blockers. We will manage him as an outpatient on medical management for now.  4.  Hyperkalemia, which has resolved: Plan to restart lisinopril.  5. Hypertension, remained stable: Continue lisinopril.  6. Gout, which is stable: The patient was to remain off the allopurinol, and we will hold that for now.  7. Hypothyroidism, remained stable at this  time: We will continue with the home Synthroid as has been managed by Dr. Tedd Sias as an outpatient.  8. Other chronic medical lesions remained stable.   DISPOSITION: He is stable for discharge. He will follow up with Oncology this week. He will follow up with Dr. Burnadette Pop within 1 week and Dr. Gwen Pounds in 1 to 2 weeks. He is okay to return to work on Thursday.   ____________________________ Marisue Ivan, MD kl:cbb D: 10/03/2011 08:14:00 ET T: 10/03/2011 11:20:35 ET JOB#: [id number]  cc: Marisue Ivan, MD, <Dictator> Lamar Blinks, MD [facility] MD ELECTRONICALLY SIGNED 10/12/2011 12:02

## 2014-10-03 NOTE — Op Note (Signed)
PATIENT NAME:  Joel Schultz, Joel Schultz MR#:  [id number] DATE OF BIRTH:  July 06, 1933  DATE OF PROCEDURE:  10/09/2011  PREOPERATIVE DIAGNOSIS: Lung cancer.   POSTOPERATIVE DIAGNOSIS: Lung cancer.   PROCEDURE PERFORMED: Insertion of central venous catheter with subcutaneous infusion port.   SURGEON: Renda RollsWilton Dacie Mandel, M.D.   ANESTHESIA: Local 1% Xylocaine with monitored anesthesia care.   INDICATIONS: This 81 year old male had recent findings of lung cancer and insertion of the port was indicated for venous access for chemotherapy.   DESCRIPTION OF PROCEDURE: The patient was placed on the operating table in the supine position. His IV was infiltrated and the anesthetist and anesthesiologist were working to try to find an IV site.   I found a small vein in the left foot and prepared this with alcohol and attempted a 20-gauge angiocatheter. However, it did not thread into the vein and the catheter was removed.  I subsequently found another vein overlying the right deltoid muscle and this was cleaned with alcohol and was accessed with a 20-gauge angiocatheter and was successful with good blood return. I attached extension tubing and IV and it flowed well. It was dressed with Tegaderm.   Following this, the right arm was tucked. A rolled blanket was placed behind the shoulder blades. Head was placed on a doughnut ring so that the neck was extended. The head was turned 20 degrees to the left. The left subclavian area and neck were prepared with ChloraPrep and draped in a sterile manner.   The skin beneath the clavicle was infiltrated with 1% Xylocaine. A transversely-oriented 3-cm incision was made, carried down through subcutaneous tissues. Several small bleeding points were cauterized. A subcutaneous pouch was created inferior to the incision large enough to admit the PFM port. The jugular vein was identified with ultrasound and the skin overlying the jugular vein was infiltrated with Xylocaine. A  transversely-oriented 6-mm incision was made. Next, with the patient in the Trendelenburg position. The vein was further demonstrated. I also identified the carotid artery. The vein and artery both appeared normal.   With ultrasound guidance, a needle was inserted into the jugular vein and aspirated blood. Next, the guidewire was inserted and an image of the ultrasound finding was recorded for the paper chart. Next, fluoroscopy was used to see the position of the guidewire in the vena cava. Next, the needle was withdrawn and the dilator and introducer sheath were advanced over the guidewire. The guidewire and dilator were removed. The catheter was inserted through the sheath and the sheath peeled away. Next, the tip of the catheter was positioned in the superior vena cava as demonstrated with fluoroscopy, and a fluoroscopic image was saved for the paper chart for the patient's record. Next, the catheter was tunneled down to the subclavian incision. Pressure was held over the tunnel site. Next, the catheter was cut to fit and attached to the PFM port using the accompanying sleeve to secure it. The port was accessed with a Huber needle and aspirated a trace of blood and flushed with 10 mL of saline. Next, the port was placed into the subcutaneous pouch and sutured to the deep fascia with 4-0 silk. Next, the pouch was closed with interrupted 5-0 Vicryl. Subsequently, both incisions were closed with 5-0 Vicryl subcuticular sutures and Dermabond. The patient tolerated surgery satisfactorily and was then prepared for transfer to the recovery room.    ____________________________ [name], MD jws:bjt D: 10/09/2011 12:07:00 ET T: 10/09/2011 12:34:41 ET JOB#: [id number]  cc: Michaelyn BarterJ. Schultz  Katrinka Blazing, MD, <Dictator> Adella Hare MD ELECTRONICALLY SIGNED 10/09/2011 20:58

## 2014-10-03 NOTE — Consult Note (Signed)
Chief Complaint:   Subjective/Chief Complaint spoke to Dr Caryl Comes and also to Dr Nehemiah Massed. He has a lung mass likely and is also to have CABG. I think the lung dignosis should be made prior to having the bypass but will needd to have cardiology clear for the procedure   VITAL SIGNS/ANCILLARY NOTES: **Vital Signs.:   22-Apr-13 12:48   Vital Signs Type Routine   Temperature Temperature (F) 98.2   Celsius 36.7   Pulse Pulse 95   Respirations Respirations 16   Systolic BP Systolic BP 948   Diastolic BP (mmHg) Diastolic BP (mmHg) 65   Mean BP 79   Pulse Ox % Pulse Ox % 96   Pulse Ox Activity Level  At rest   Oxygen Delivery Room Air/ 21 %  *Intake and Output.:   Daily 22-Apr-13 07:00   Grand Totals Intake:  1680 Output:  1450    Net:  230 24 Hr.:  230   Oral Intake      In:  1680   Urine ml     Out:  1450   Length of Stay Totals Intake:  8386 Output:  0165    Net:  1411   Brief Assessment:   Cardiac Regular  -- LE edema    Respiratory normal resp effort  clear BS  no use of accessory muscles    Gastrointestinal details normal Soft  Nontender  Bowel sounds normal   Routine Hem:  22-Apr-13 03:02    Hematocrit (CBC) 27.4  Routine Chem:  22-Apr-13 03:02    Glucose, Serum 109   BUN 14   Creatinine (comp) 1.08   Sodium, Serum 133   Potassium, Serum 4.2   Chloride, Serum 97   CO2, Serum 27   Calcium (Total), Serum 8.3   Anion Gap 9   Osmolality (calc) 267   eGFR (African American) >60   eGFR (Non-African American) >60  Blood Glucose:  22-Apr-13 07:37    POCT Blood Glucose 103    12:49    POCT Blood Glucose 93   Assessment/Plan:  Assessment/Plan:   Assessment lung mass -await cardiology input -will make decision about bronch based on cardiology recs   Electronic Signatures: Allyne Gee (MD)  (Signed 22-Apr-13 14:20)  Authored: Chief Complaint, VITAL SIGNS/ANCILLARY NOTES, Brief Assessment, Lab Results, Assessment/Plan   Last Updated: 22-Apr-13 14:20 by  Allyne Gee (MD)

## 2014-10-03 NOTE — Consult Note (Signed)
PATIENT NAME:  Joel Schultz, Joel Schultz MR#:  [id number] DATE OF BIRTH:  28-Feb-1934  DATE OF CONSULTATION:  09/29/2011  REFERRING PHYSICIAN:   CONSULTING PHYSICIAN:  Yevonne PaxSaadat A. Elgin Carn, MD  REASON FOR CONSULTATION:  Pulmonary mass, probable malignancy.  HISTORY OF PRESENT ILLNESS: This is a 81 year old gentleman who has a history of coronary artery disease and is scheduled for possible surgery for the coronary artery disease. He underwent catheterization on 03/21 and was noted to have significant atherosclerosis. The patient also has multiple medical issues with hip replacements and has a history of malignant colon polyps. The patient had a CT scan done yesterday without contrast showing a mass basically that is significant in the left mainstem. There is what appears almost to be invasion of the airway so he is probably going to need an airway exam. The patient has had negative occult feces checked and does not appear to be having any active bleeding right now.  Another issue is with his renal failure he has been by nephrology and their feeling was to hydrate him with normal saline and monitor his Is and Os.  They feel that he has chronic kidney disease stage II, baseline creatinine 1.1 that was done about March 04. Also adequate blood pressure control was recommended.   PAST MEDICAL HISTORY: As outlined above.   PAST SURGICAL HISTORY: As outlined above.   SOCIAL HISTORY: The patient lives by himself and he does not smoke or drink.   REVIEW OF SYSTEMS: As noted above. Otherwise twelve-point review of system was done and is unremarkable.   PHYSICAL EXAMINATION:  GENERAL: At the time that he was seen he is comfortable without distress.   VITAL SIGNS: Temperature 98, pulse 101, respiratory rate 16, blood pressure 112/62. Saturations were 92%, and that was on room air.   NECK: Appeared supple. There was no JVD. No adenopathy. No thyromegaly.   HEENT: Mouth clear. Extraocular movements intact.   CHEST:  Coarse breath sounds, a few rhonchi, no rales. Expansion was equal.   CARDIOVASCULAR: S1, S2 normal. Regular rhythm. No gallop or rub.   NEUROLOGIC: The patient was moving all four extremities.   ABDOMEN: Soft, nontender.   SKIN: No acute rashes.   MUSCULOSKELETAL: Without synovitis.   LABORATORY DATA: Admission labs showed a white count of 14 down to 11.8. Hemoglobin is 8.3. The patient had chemistries done which showed a creatinine of 2.93 and after hydration his creatinine has come down to 1.44. CT scan which was noncontrasted showed probable invasive lesion of the left mainstem bronchus.   IMPRESSION: Lung mass of unclear etiology, likely malignant.   PLAN: He needs airway examination I think prior to committing him to surgery for bypass. The patient ideally should have a PET scan done to assess also. We will try to see about getting that scheduled. We will continue with full supportive care and monitor and possibly do bronchoscopy in the  coming week.   ____________________________ Yevonne PaxSaadat A. Enio Hornback, MD sak:bjt D: 09/29/2011 15:20:29 ET T: 09/29/2011 15:47:03 ET JOB#: [id number]  cc: Yevonne PaxSaadat A. Chelbie Jarnagin, MD, <Dictator> Yevonne PaxSAADAT A Amerika Nourse MD ELECTRONICALLY SIGNED 10/18/2011 13:28

## 2014-10-03 NOTE — H&P (Signed)
PATIENT NAME:  Joel Schultz, Joel Schultz MR#:  [id number] DATE OF BIRTH:  1933/07/08  DATE OF ADMISSION:  09/27/2011  PRIMARY CARE PHYSICIAN: Dion Body, MD  CHIEF COMPLAINT: Fatigue, malaise.   HISTORY OF PRESENT ILLNESS: This is a 81 year old male with history of coronary artery disease, hyperlipidemia, hypothyroidism, gout, hypertension, borderline diabetes, and morbid obesity who is being admitted from the office with acute renal failure with no previous history of renal disease. The patient underwent catheterization on 08/30/2011 per Dr. Nehemiah Massed. Over the course of the next several weeks he has noticed more malaise and fatigue. He was seen in the clinic per Nani Gasser on 09/19/2011 and had a urinalysis at that time that was negative and had a MET-B showed creatinine increasing to 1.8. Baseline creatinine prior to that was between 1.1 and 1.4. He denies any new medications or antibiotics. He states that his urine output has been about the same. He has stopped the Lasix. He denies any nausea, vomiting, chest pain, syncopal episodes, or flank pain. He also denies any hematuria. He is being directly admitted for this acute renal failure with a creatinine of 3.1 in the office, on 2011-10-22. He also has hyperkalemia with a potassium of 6.5 and a white blood cell count is 17.7, but he is afebrile.   PAST MEDICAL HISTORY: As stated above.   PAST SURGICAL HISTORY:  1. Atherosclerotic heart disease, status post angioplasty with unclear date.  2. Status post bilateral hip replacements; right in November 1998, left in January 1999. Repeat left hip replacement in 2005 and right hip replacement in 2007. 3. Malignant colon polyp with colon resection in the past.   MEDICATIONS: 1. Allopurinol 300 mg p.o. daily.  2. Aspirin 81 mg p.o. daily.  3. Lipitor 20 mg p.o. daily.  4. Lisinopril 10 mg p.o. daily.  5. Multivitamin 1 tab daily.  6. MiraLax 17 grams daily.  7. Synthroid 200 mcg p.o. daily.   ALLERGIES:  No known drug allergies.   FAMILY HISTORY: Father has a history of coronary artery disease and is status post myocardial infarction and stomach cancer. Mother has a history of emphysema. Siblings have no known medical.   SOCIAL HISTORY: He is widowed and lives by himself. He is employed at the [employer]. No tobacco. No alcohol. No drug use.   REVIEW OF SYSTEMS: As stated above.   PHYSICAL EXAMINATION: Temperature is 98.3, blood pressure 96/76 with a heart rate of 113 and oxygen saturation 99% on room air.   GENERAL: This is non-ill-appearing obese male in no apparent distress.   HEENT: Extraocular movements are intact. Pupils are equal and reactive to light and accommodation. Moist mucous membranes.   NECK: Supple neck examination.   CARDIOVASCULAR: Regular rate and rhythm. No murmurs, rubs, or gallops. Normal S1 and S2.   RESPIRATORY: Clear to auscultation. No crackles heard on exam.   ABDOMEN: Obese, soft, nontender, and nondistended.   EXTREMITIES: No edema, nontender to palpation.   SKIN: He does appear mildly jaundice, but no rashes or lesions. No pruritus.   NEURO: Alert and oriented x3. No focal deficits on exam.   LABS/STUDIES: Labs from the clinic on Oct 22, 2011 shows a white blood cell count of 17.7, hemoglobin 9.2, platelet count 915, and MCV 75.7. TSH 6.173. Creatinine 3.1, BUN 56, potassium 6.5, sodium 130, chloride 92, and CO2 25.   ASSESSMENT AND PLAN: This is 81 year old male being admitted for acute renal failure with multiple comorbidities.  1. Acute renal failure: The patient has  a creatinine of 3.1, BUN of 56, and a GFR of 20. He has no prior history of chronic renal failure. Baseline is typically between 1.1 and 1.4. He had undergone catheterization on 08/30/2011 per Dr. Nehemiah Massed and I think that this is most likely contrast induced acute renal failure. I have spoken with Dr. Lavonia Dana with [facility] and he agrees that patient would be best  served to be admitted. The patient has a potassium at this time of 6.5 performed yesterday. We will reassess that here in the hospital. We will increase the IV fluids. We will give him a bolus of normal saline, 500 mL x1, and then start him on normal saline at 175 mL/hour. We will also obtain a renal ultrasound. We will check a urinalysis and a urine culture upon admission. We will hold on his lisinopril, hold on the aspirin, and hold on the allopurinol at this time.  2. History of coronary artery disease, status post history of angioplasty: The patient is asymptomatic at this time. He denies any chest discomfort. Again, he underwent catheterization that did show some blockages and also underwent a stress test that showed some myocardial ischemia with myocardial perfusion defects. He has plans to undergo a [facility]G at [facility] per Dr. Orlie Pollen next week. We will keep him on telemetry, plan to hold the aspirin, he is intolerant of beta blockers, and also hold lisinopril for his acute renal failure. We will keep him on heparin 5000 three times daily at this time. We will cycle cardiac enzymes to make sure this is not a heart issue, also obtain an EKG.  3. History of hyperlipidemia: It is stable. His last total cholesterol was 147. We will continue with Lipitor, per his home dose.  4. Hypothyroidism: He is stable at this time. TSH was 0.224. He is followed by Dr. Gabriel Carina. We will continue this regimen as it was recently decreased because of the low TSH.  5. Leukocytosis of unclear etiology, likely stress induced: We will hold off on antibiotics as he is afebrile and no source. We will check a urinalysis, urine culture, and chest x-ray before starting antibiotics as he is currently hemodynamically stable. If his blood pressure continues to drop despite IV fluids, then we will start an antibiotic, or if he has a fever.  6. Thrombocytosis, likely acute phase reactant to his stress: We will continue to monitor that  closely.  7. Borderline diabetes: He is diet controlled. We will continue to monitor daily. We will cover with sliding scale insulin.        DISPOSITION: We will consult nephrology. I have consulted Dr. Nehemiah Massed to notify him of the patient's status. The patient will be placed on an inpatient status on telemetry floor. Multiple studies and labs are pending at this time. He is a FULL CODE. ____________________________ Dion Body, MD kl:slb D: 09/27/2011 12:20:18 ET T: 09/27/2011 12:51:44 ET JOB#: [id number]  cc: Dion Body, MD, <Dictator> Dion Body MD ELECTRONICALLY SIGNED 10/12/2011 12:02

## 2014-10-03 NOTE — Consult Note (Signed)
PATIENT NAME:  Joel Schultz, Joel Schultz MR#:  [id number] DATE OF BIRTH:  Oct 10, 1933  DATE OF CONSULTATION:  10/01/2011  REFERRING PHYSICIAN:  Freda MunroSaadat Khan, MD CONSULTING PHYSICIAN:  Lamar BlinksBruce J. Jeanelle Dake, MD  PRIMARY CARE PHYSICIAN: Daniel NonesBert Klein, MD  REASON FOR CONSULTATION: Severe shortness of breath with lung and endobronchial mass, known coronary artery disease, heart block, and hypertension.   CHIEF COMPLAINT: Shortness of breath.   HISTORY OF PRESENT ILLNESS: This is a 81 year old male with known coronary artery disease, with three-vessel coronary artery disease recently diagnosed by cardiac catheterization about six weeks prior. The patient was relatively stable at the time from previous stent placement, but had an abnormal stress test with shortness of breath. The patient then was seen by a cardiovascular surgeon and prepped for the surgery. He has now some renal insufficiency and a cough which is possibly a side effect of his current lung issues as well as concerns for additional medications including beta blocker. The patient has had improvements of some of his shortness of breath after discontinuation of the beta blocker. The patient now is comfortable at this time. He has not had any new evidence of significant substernal chest pressure, acute coronary syndrome, or elevation of troponin. EKG has shown normal sinus rhythm with first-degree AV block and telemetry has shown normal sinus rhythm with second-degree type I AV block. There has been no other advanced heart block. The patient has not had any congestive heart failure at this time, but does have 1+ lower extremity edema. With this patient having a lung mass, he will need further evaluation.  REVIEW OF SYSTEMS: The remainder of review of systems is negative for vision change, ringing in the ears, hearing loss, congestion, heartburn, nausea, vomiting, diarrhea, bloody stools, stomach pain, extremity pain, leg weakness, cramping of the buttocks, known  blood clots, headaches, blackouts, dizzy spells, nosebleeds, congestion, trouble swallowing, frequent urination, urination at night, muscle weakness, numbness, anxiety, depression, skin lesions, or skin rashes.   PAST MEDICAL HISTORY:  1. Known coronary artery disease with three vessel coronary artery disease, stable.  2. Hypertension.  3. Hyperlipidemia.  4. Heart block.  5. Lung mass.   FAMILY HISTORY: No family members with early onset of cardiovascular disease or hypertension.   SOCIAL HISTORY: Currently denies alcohol or tobacco use, but has remote tobacco use.   ALLERGIES: He has no known drug allergies.   CURRENT MEDICATIONS: As listed.   PHYSICAL EXAMINATION:   VITAL SIGNS: Blood is pressure 126/68 bilaterally and heart rate is 72 upright and reclining and regular.  GENERAL:  He is well appearing male in no distress.   HEENT: No icterus, thyromegaly, ulcers, hemorrhage, or xanthelasma.   HEART: Regular rate and rhythm. Normal S1 and S2 with distant heart sounds. Point of maximal impulse is diffuse. Carotid upstroke is normal without bruit. Jugular venous pressure is normal.   LUNGS: Lungs have few basilar crackles and wheezing.   ABDOMEN: Soft and nontender without hepatosplenomegaly or masses. Abdominal aorta is normal size without bruit.   EXTREMITIES: 2+ bilateral pulses in dorsal, pedal, radial, and femoral arteries without lower extremity edema, cyanosis, clubbing, or ulcers.   NEUROLOGIC: He is oriented to time, place, and person with normal mood and affect.   ASSESSMENT: This is a 81 year old male with three vessel coronary artery disease without evidence of acute myocardial infarction stable at this time without angina or congestive heart failure or elevation of troponin with a stable  EKG with nonsignificant first degree AV block with  shortness of breath most consistent with lung issues and lung mass needing further bronchoscopy.   RECOMMENDATIONS:  1. Proceed  to bronchoscopy for which he is at low risk at this time for cardiovascular complication to further diagnose lung mass and further treatment options. 2. Avoid beta blocker due to significant side effects of bradycardia and shortness of breath.  3. Hypertension control with goal systolic pressure below 140 mmHg, but Avoid ACE inhibitors at this time due to recent renal insufficiency.  4. No additional medications or treatment needed for first degree AV block.  5. Continue aspirin for further risk reduction and cardiovascular event.  6. No other cardiac diagnostics are necessary at this time. We will follow along for above issues.  ____________________________ Lamar Blinks, MD [email or web address] D: 10/01/2011 17:26:13 ET T: 10/02/2011 09:06:55 ET JOB#: [id number]  cc: Lamar Blinks, MD, <Dictator> Lamar Blinks MD ELECTRONICALLY SIGNED 10/05/2011 12:57
